# Patient Record
Sex: Female | Born: 1935 | Race: White | Hispanic: No | Marital: Single | Smoking: Former smoker
Health system: Southern US, Community
[De-identification: ages and names within clinical notes are randomized; demographics above are authoritative.]

## PROBLEM LIST (undated history)

## (undated) DIAGNOSIS — C189 Malignant neoplasm of colon, unspecified: Secondary | ICD-10-CM

## (undated) DIAGNOSIS — I10 Essential (primary) hypertension: Secondary | ICD-10-CM

## (undated) DIAGNOSIS — M069 Rheumatoid arthritis, unspecified: Secondary | ICD-10-CM

## (undated) DIAGNOSIS — E876 Hypokalemia: Secondary | ICD-10-CM

## (undated) DIAGNOSIS — E871 Hypo-osmolality and hyponatremia: Secondary | ICD-10-CM

## (undated) DIAGNOSIS — M810 Age-related osteoporosis without current pathological fracture: Secondary | ICD-10-CM

## (undated) DIAGNOSIS — C880 Waldenstrom macroglobulinemia: Secondary | ICD-10-CM

## (undated) DIAGNOSIS — C8590 Non-Hodgkin lymphoma, unspecified, unspecified site: Secondary | ICD-10-CM

## (undated) HISTORY — PX: APPENDECTOMY: SHX54

## (undated) HISTORY — PX: ABDOMINAL HYSTERECTOMY: SHX81

## (undated) HISTORY — PX: PARTIAL COLECTOMY: SHX5273

---

## 2012-10-18 DIAGNOSIS — C189 Malignant neoplasm of colon, unspecified: Secondary | ICD-10-CM

## 2012-10-18 HISTORY — DX: Malignant neoplasm of colon, unspecified: C18.9

## 2013-05-16 ENCOUNTER — Emergency Department (HOSPITAL_BASED_OUTPATIENT_CLINIC_OR_DEPARTMENT_OTHER): Payer: Medicare Other

## 2013-05-16 ENCOUNTER — Inpatient Hospital Stay (HOSPITAL_BASED_OUTPATIENT_CLINIC_OR_DEPARTMENT_OTHER)
Admission: EM | Admit: 2013-05-16 | Discharge: 2013-05-22 | DRG: 871 | Disposition: A | Discharge status: Skilled Nursing Facility | Payer: Medicare Other | Attending: Internal Medicine | Admitting: Internal Medicine

## 2013-05-16 ENCOUNTER — Encounter (HOSPITAL_BASED_OUTPATIENT_CLINIC_OR_DEPARTMENT_OTHER): Payer: Self-pay | Admitting: Emergency Medicine

## 2013-05-16 DIAGNOSIS — M069 Rheumatoid arthritis, unspecified: Secondary | ICD-10-CM

## 2013-05-16 DIAGNOSIS — Z7982 Long term (current) use of aspirin: Secondary | ICD-10-CM

## 2013-05-16 DIAGNOSIS — E785 Hyperlipidemia, unspecified: Secondary | ICD-10-CM

## 2013-05-16 DIAGNOSIS — R197 Diarrhea, unspecified: Secondary | ICD-10-CM | POA: Diagnosis present

## 2013-05-16 DIAGNOSIS — I10 Essential (primary) hypertension: Secondary | ICD-10-CM

## 2013-05-16 DIAGNOSIS — A419 Sepsis, unspecified organism: Secondary | ICD-10-CM | POA: Diagnosis present

## 2013-05-16 DIAGNOSIS — Z85038 Personal history of other malignant neoplasm of large intestine: Secondary | ICD-10-CM

## 2013-05-16 DIAGNOSIS — C189 Malignant neoplasm of colon, unspecified: Secondary | ICD-10-CM

## 2013-05-16 DIAGNOSIS — J329 Chronic sinusitis, unspecified: Secondary | ICD-10-CM | POA: Diagnosis present

## 2013-05-16 DIAGNOSIS — E876 Hypokalemia: Secondary | ICD-10-CM | POA: Diagnosis present

## 2013-05-16 DIAGNOSIS — R4182 Altered mental status, unspecified: Secondary | ICD-10-CM

## 2013-05-16 DIAGNOSIS — IMO0002 Reserved for concepts with insufficient information to code with codable children: Secondary | ICD-10-CM

## 2013-05-16 DIAGNOSIS — Z66 Do not resuscitate: Secondary | ICD-10-CM | POA: Diagnosis present

## 2013-05-16 DIAGNOSIS — Z79899 Other long term (current) drug therapy: Secondary | ICD-10-CM

## 2013-05-16 DIAGNOSIS — J189 Pneumonia, unspecified organism: Secondary | ICD-10-CM | POA: Diagnosis present

## 2013-05-16 HISTORY — DX: Rheumatoid arthritis, unspecified: M06.9

## 2013-05-16 HISTORY — DX: Essential (primary) hypertension: I10

## 2013-05-16 LAB — CBC WITH DIFFERENTIAL/PLATELET
BASOS PCT: 0 % (ref 0–1)
Basophils Absolute: 0 10*3/uL (ref 0.0–0.1)
EOS ABS: 0 10*3/uL (ref 0.0–0.7)
Eosinophils Relative: 0 % (ref 0–5)
HEMATOCRIT: 39 % (ref 36.0–46.0)
HEMOGLOBIN: 13.1 g/dL (ref 12.0–15.0)
Lymphocytes Relative: 3 % — ABNORMAL LOW (ref 12–46)
Lymphs Abs: 0.7 10*3/uL (ref 0.7–4.0)
MCH: 28.7 pg (ref 26.0–34.0)
MCHC: 33.6 g/dL (ref 30.0–36.0)
MCV: 85.5 fL (ref 78.0–100.0)
MONOS PCT: 4 % (ref 3–12)
Monocytes Absolute: 0.8 10*3/uL (ref 0.1–1.0)
NEUTROS PCT: 93 % — AB (ref 43–77)
Neutro Abs: 21.5 10*3/uL — ABNORMAL HIGH (ref 1.7–7.7)
Platelets: 371 10*3/uL (ref 150–400)
RBC: 4.56 MIL/uL (ref 3.87–5.11)
RDW: 16.4 % — ABNORMAL HIGH (ref 11.5–15.5)
WBC: 23.1 10*3/uL — ABNORMAL HIGH (ref 4.0–10.5)

## 2013-05-16 LAB — COMPREHENSIVE METABOLIC PANEL
ALT: 17 U/L (ref 0–35)
AST: 25 U/L (ref 0–37)
Albumin: 3.6 g/dL (ref 3.5–5.2)
Alkaline Phosphatase: 86 U/L (ref 39–117)
BILIRUBIN TOTAL: 0.4 mg/dL (ref 0.3–1.2)
BUN: 14 mg/dL (ref 6–23)
CHLORIDE: 92 meq/L — AB (ref 96–112)
CO2: 26 mEq/L (ref 19–32)
Calcium: 9.3 mg/dL (ref 8.4–10.5)
Creatinine, Ser: 0.6 mg/dL (ref 0.50–1.10)
GFR calc non Af Amer: 83 mL/min — ABNORMAL LOW (ref >90)
GLUCOSE: 116 mg/dL — AB (ref 70–99)
POTASSIUM: 3 meq/L — AB (ref 3.7–5.3)
Sodium: 135 mEq/L — ABNORMAL LOW (ref 137–147)
TOTAL PROTEIN: 6.5 g/dL (ref 6.0–8.3)

## 2013-05-16 LAB — URINALYSIS, ROUTINE W REFLEX MICROSCOPIC
Bilirubin Urine: NEGATIVE
Glucose, UA: NEGATIVE mg/dL
KETONES UR: NEGATIVE mg/dL
LEUKOCYTES UA: NEGATIVE
Nitrite: NEGATIVE
PROTEIN: NEGATIVE mg/dL
Specific Gravity, Urine: 1.009 (ref 1.005–1.030)
Urobilinogen, UA: 0.2 mg/dL (ref 0.0–1.0)
pH: 7 (ref 5.0–8.0)

## 2013-05-16 LAB — I-STAT CG4 LACTIC ACID, ED: LACTIC ACID, VENOUS: 1.75 mmol/L (ref 0.5–2.2)

## 2013-05-16 LAB — URINE MICROSCOPIC-ADD ON

## 2013-05-16 LAB — CBG MONITORING, ED: Glucose-Capillary: 115 mg/dL — ABNORMAL HIGH (ref 70–99)

## 2013-05-16 MED ORDER — VANCOMYCIN HCL IN DEXTROSE 1-5 GM/200ML-% IV SOLN
1000.0000 mg | Freq: Once | INTRAVENOUS | Status: AC
  Administered 2013-05-16: 1000 mg via INTRAVENOUS
  Filled 2013-05-16: qty 200

## 2013-05-16 MED ORDER — CEFEPIME HCL 2 G IJ SOLR
INTRAMUSCULAR | Status: AC
  Filled 2013-05-16: qty 2

## 2013-05-16 MED ORDER — ACETAMINOPHEN 650 MG RE SUPP
RECTAL | Status: AC
  Filled 2013-05-16: qty 1

## 2013-05-16 MED ORDER — SODIUM CHLORIDE 0.9 % IV SOLN
1000.0000 mL | INTRAVENOUS | Status: DC
  Administered 2013-05-17: 1000 mL via INTRAVENOUS

## 2013-05-16 MED ORDER — ACETAMINOPHEN 650 MG RE SUPP
650.0000 mg | Freq: Once | RECTAL | Status: AC
  Administered 2013-05-16: 650 mg via RECTAL

## 2013-05-16 MED ORDER — SODIUM CHLORIDE 0.9 % IV SOLN
1000.0000 mL | Freq: Once | INTRAVENOUS | Status: AC
  Administered 2013-05-16: 1000 mL via INTRAVENOUS

## 2013-05-16 MED ORDER — DEXTROSE 5 % IV SOLN
2.0000 g | Freq: Once | INTRAVENOUS | Status: AC
  Administered 2013-05-16: 2 g via INTRAVENOUS

## 2013-05-16 NOTE — ED Notes (Signed)
tct [facility] to verify medications, pt states that she has them filled at [facility], but is unable to verify what she takes daily

## 2013-05-16 NOTE — ED Notes (Signed)
2 liters nasal cannula applied.

## 2013-05-16 NOTE — ED Notes (Signed)
Code sepsis initiated per md request and protocol

## 2013-05-16 NOTE — ED Notes (Signed)
Spoke with provider re: pt code sepsis status.

## 2013-05-16 NOTE — ED Notes (Signed)
Received information from EMS for [facility], pt stated that her name was not Nunzio Cory  That it was Rebecca Hodges, pt with no identification on her or given at time of ems arrival. Pt did state that she gets her medications filled at [facility] and that her address is [address] [place]

## 2013-05-16 NOTE — ED Notes (Signed)
Pt reports that she has had n/v/d for weeks, has seen pcp, also c/o lower back pain, pt became non compliant with ems refusing to answer questions,

## 2013-05-16 NOTE — ED Provider Notes (Signed)
CSN: [id number]     Arrival date & time 05/16/13  2205 History   First MD Initiated Contact with Patient 05/16/13 2214     Chief Complaint  Patient presents with  . Diarrhea     (Consider location/radiation/quality/duration/timing/severity/associated sxs/prior Treatment) HPI Comments: Patient is an 80 year old female with a past medical history of rheumatoid arthritis who presents to the emergency department via EMS from her independent living facility after she called 911 complaining of nausea, vomiting and diarrhea "for weeks" according to EMS. She has seen her primary care physician, however is unable to give any information about this visit. It is noted she was noncompliant with EMS refused to answer questions. Patient herself only states she has low back pain, however tells the nurses she had nausea, vomiting and diarrhea which she denies to myself. She seems confused, unable to provide any information to the history, level 5 caveat due to altered mental status.  Patient is a 80 y.o. female presenting with diarrhea. The history is provided by the EMS personnel.  Diarrhea   Past Medical History  Diagnosis Date  . Rheumatoid arthritis    History reviewed. No pertinent past surgical history. History reviewed. No pertinent family history. History  Substance Use Topics  . Smoking status: Never Smoker   . Smokeless tobacco: Not on file  . Alcohol Use: No   OB History   Grav Para Term Preterm Abortions TAB SAB Ect Mult Living                 Review of Systems  Unable to perform ROS: Mental status change  Gastrointestinal: Positive for diarrhea.      Allergies  Penicillins  Home Medications   Current Outpatient Rx  Name  Route  Sig  Dispense  Refill  . diltiazem (CARDIZEM CD) 240 MG 24 hr capsule   Oral   Take 240 mg by mouth daily.         . folic acid (FOLVITE) 1 MG tablet   Oral   Take 1 mg by mouth daily.         . hydrochlorothiazide (HYDRODIURIL) 25 MG  tablet   Oral   Take 25 mg by mouth daily.         . methotrexate (RHEUMATREX) 2.5 MG tablet   Oral   Take 15 mg by mouth once a week. Caution:Chemotherapy. Protect from light.         . metoprolol (LOPRESSOR) 50 MG tablet   Oral   Take 75 mg by mouth every morning.         . predniSONE (DELTASONE) 5 MG tablet   Oral   Take 5 mg by mouth daily with breakfast.         . simvastatin (ZOCOR) 10 MG tablet   Oral   Take 10 mg by mouth daily.          BP 193/88  Pulse 92  Temp(Src) 102.5 F (39.2 C) (Rectal)  Resp 22  SpO2 95% Physical Exam  Nursing note and vitals reviewed. Constitutional: She appears well-developed and well-nourished. She appears ill.  HENT:  Head: Normocephalic and atraumatic.  Mouth/Throat: Oropharynx is clear and moist. Mucous membranes are dry.  Eyes: Conjunctivae are normal.  Neck: Normal range of motion. Neck supple.  Cardiovascular: Normal rate, regular rhythm and normal heart sounds.   Pulmonary/Chest: Effort normal. She has no wheezes. She has rhonchi (right lung field, minimal). She has no rales.  Wet sounding cough present.  Abdominal: Soft. Bowel sounds are normal. She exhibits no distension.  No peritoneal signs. Pt groans throughout abdominal exam.  Musculoskeletal: Normal range of motion. She exhibits no edema.  Neurological: GCS eye subscore is 3. GCS verbal subscore is 4. GCS motor subscore is 5.  Skin: Skin is warm and dry. She is not diaphoretic.    ED Course  Procedures (including critical care time) Labs Review Labs Reviewed  CBC WITH DIFFERENTIAL - Abnormal; Notable for the following:    WBC 23.1 (*)    RDW 16.4 (*)    Neutrophils Relative % 93 (*)    Neutro Abs 21.5 (*)    Lymphocytes Relative 3 (*)    All other components within normal limits  COMPREHENSIVE METABOLIC PANEL - Abnormal; Notable for the following:    Sodium 135 (*)    Potassium 3.0 (*)    Chloride 92 (*)    Glucose, Bld 116 (*)    GFR calc non  Af Amer 83 (*)    All other components within normal limits  URINALYSIS, ROUTINE W REFLEX MICROSCOPIC - Abnormal; Notable for the following:    Hgb urine dipstick TRACE (*)    All other components within normal limits  CBG MONITORING, ED - Abnormal; Notable for the following:    Glucose-Capillary 115 (*)    All other components within normal limits  CULTURE, BLOOD (ROUTINE X 2)  CULTURE, BLOOD (ROUTINE X 2)  URINE CULTURE  URINE MICROSCOPIC-ADD ON  LACTIC ACID, PLASMA  I-STAT CG4 LACTIC ACID, ED   Imaging Review Dg Chest Port 1 View  05/16/2013   CLINICAL DATA:  Altered mental status, nausea vomiting and diarrhea 4 weeks  EXAM: PORTABLE CHEST - 1 VIEW  COMPARISON:  Recent prior chest x-ray 04/21/2013; CT chest 04/28/2013  FINDINGS: Interval development of dense right basilar infiltrate. Cardiac and mediastinal contours are unchanged. There is mild cardiomegaly. Slightly increased pulmonary vascular congestion. Persistent background changes of emphysema and COPD. No acute osseous abnormality.  IMPRESSION: Interval development of dense right basilar infiltrate concerning for pneumonia or potentially aspiration.   Electronically Signed   By: Jacqulynn Cadet M.D.   On: 05/16/2013 22:50     EKG Interpretation   Date/Time:  Monday May 16 2013 23:14:43 EDT Ventricular Rate:  124 PR Interval:    QRS Duration: 160 QT Interval:  416 QTC Calculation: 597 R Axis:   135 Text Interpretation:  Wide QRS tachycardia Right bundle branch block Left  posterior fascicular block T wave abnormality, consider inferior ischemia  Abnormal ECG No previous ECGs available Reconfirmed by RANCOUR  MD,  STEPHEN [phone]) on 05/17/2013 12:38:31 AM      MDM   Final diagnoses:  CAP (community acquired pneumonia)  Altered mental status   Pt presenting with fever, AMS, n/v/d. Febrile at 102.5 rectally.EMS unable to get a good history from patient. No tachycardia. Respirations 22. Given patient's presentation  and altered mental status, will do septic workup. Harsh sounding cough, O2 sat 92% on room air. Labs, CXR pending. If normal creatinine, will obtain CT abdomen/pelvis. 11:06 PM Creatinine normal. Will obtain CT abdomen/pelvis with contrast. Chest x-ray showing interval development of dense right basilar infiltrate concerning for pneumonia or potentially aspiration, will start IV vancomycin and cefepime, these antibiotics chosen by both myself and Dr. Wyvonnia Dusky as patient may also have an intra-abdominal infection as well and will need broad-spectrum. 11:18 PM Pt now tachycardic. Meets criteria for code sepsis. Will call code sepsis. 11:39 PM Daughter spoke with  nurses on phone, states pt was normal yesterday, has had a cough for a few weeks, PCP said it was bronchitis. Pt is a DNR. 12:30 AM  After receiving 3 L of fluid, patient had a complete turn around and appears very well. She is alert and oriented x3, sitting up and conversing normally. States she has had a cough for the past week, had an episode of diarrhea yesterday. Denies abdominal pain or vomiting. Abdomen is soft and nontender. Will cancel CT abdomen/pelvis. Patient will be admitted to [facility] for her pneumonia. Vitals have improved, temperature 100.4, O2 sat 95% on 2 L of oxygen, heart rate in the 70s. Admission accepted by Dr. Humphrey Rolls, [facility], med-surg bed. Case discussed with attending Dr. Wyvonnia Dusky who also evaluated patient and agrees with plan of care.   Illene Labrador, PA-C 05/17/13 [phone]

## 2013-05-17 ENCOUNTER — Encounter (HOSPITAL_BASED_OUTPATIENT_CLINIC_OR_DEPARTMENT_OTHER): Payer: Self-pay | Admitting: Radiology

## 2013-05-17 DIAGNOSIS — C189 Malignant neoplasm of colon, unspecified: Secondary | ICD-10-CM

## 2013-05-17 DIAGNOSIS — M069 Rheumatoid arthritis, unspecified: Secondary | ICD-10-CM

## 2013-05-17 DIAGNOSIS — J189 Pneumonia, unspecified organism: Secondary | ICD-10-CM | POA: Diagnosis present

## 2013-05-17 DIAGNOSIS — E785 Hyperlipidemia, unspecified: Secondary | ICD-10-CM

## 2013-05-17 DIAGNOSIS — I10 Essential (primary) hypertension: Secondary | ICD-10-CM

## 2013-05-17 LAB — EXPECTORATED SPUTUM ASSESSMENT W GRAM STAIN, RFLX TO RESP C

## 2013-05-17 LAB — CBC WITH DIFFERENTIAL/PLATELET
Basophils Absolute: 0 10*3/uL (ref 0.0–0.1)
Basophils Relative: 0 % (ref 0–1)
EOS PCT: 0 % (ref 0–5)
Eosinophils Absolute: 0 10*3/uL (ref 0.0–0.7)
HCT: 34.4 % — ABNORMAL LOW (ref 36.0–46.0)
HEMOGLOBIN: 11.5 g/dL — AB (ref 12.0–15.0)
Lymphocytes Relative: 6 % — ABNORMAL LOW (ref 12–46)
Lymphs Abs: 1.9 10*3/uL (ref 0.7–4.0)
MCH: 28.4 pg (ref 26.0–34.0)
MCHC: 33.4 g/dL (ref 30.0–36.0)
MCV: 84.9 fL (ref 78.0–100.0)
MONO ABS: 1 10*3/uL (ref 0.1–1.0)
Monocytes Relative: 3 % (ref 3–12)
NEUTROS PCT: 91 % — AB (ref 43–77)
Neutro Abs: 29.1 10*3/uL — ABNORMAL HIGH (ref 1.7–7.7)
Platelets: 325 10*3/uL (ref 150–400)
RBC: 4.05 MIL/uL (ref 3.87–5.11)
RDW: 16.5 % — ABNORMAL HIGH (ref 11.5–15.5)
WBC MORPHOLOGY: INCREASED
WBC: 32 10*3/uL — ABNORMAL HIGH (ref 4.0–10.5)

## 2013-05-17 LAB — BASIC METABOLIC PANEL
BUN: 11 mg/dL (ref 6–23)
CO2: 23 meq/L (ref 19–32)
Calcium: 7.6 mg/dL — ABNORMAL LOW (ref 8.4–10.5)
Chloride: 97 mEq/L (ref 96–112)
Creatinine, Ser: 0.56 mg/dL (ref 0.50–1.10)
GFR calc Af Amer: >90 mL/min (ref >90)
GFR, EST NON AFRICAN AMERICAN: 85 mL/min — AB (ref >90)
GLUCOSE: 95 mg/dL (ref 70–99)
POTASSIUM: 2.6 meq/L — AB (ref 3.7–5.3)
Sodium: 136 mEq/L — ABNORMAL LOW (ref 137–147)

## 2013-05-17 LAB — EXPECTORATED SPUTUM ASSESSMENT W REFEX TO RESP CULTURE

## 2013-05-17 MED ORDER — POTASSIUM CHLORIDE CRYS ER 20 MEQ PO TBCR
30.0000 meq | EXTENDED_RELEASE_TABLET | ORAL | Status: AC
  Administered 2013-05-17 (×3): 30 meq via ORAL
  Filled 2013-05-17 (×4): qty 1

## 2013-05-17 MED ORDER — GUAIFENESIN 100 MG/5ML PO SYRP
200.0000 mg | ORAL_SOLUTION | ORAL | Status: DC | PRN
  Administered 2013-05-17 – 2013-05-21 (×6): 200 mg via ORAL
  Filled 2013-05-17 (×6): qty 10

## 2013-05-17 MED ORDER — METOPROLOL SUCCINATE ER 50 MG PO TB24
75.0000 mg | ORAL_TABLET | Freq: Every day | ORAL | Status: DC
  Administered 2013-05-17 – 2013-05-22 (×6): 75 mg via ORAL
  Filled 2013-05-17 (×6): qty 1

## 2013-05-17 MED ORDER — ASPIRIN EC 81 MG PO TBEC
81.0000 mg | DELAYED_RELEASE_TABLET | Freq: Every day | ORAL | Status: DC
  Administered 2013-05-17 – 2013-05-21 (×5): 81 mg via ORAL
  Filled 2013-05-17 (×6): qty 1

## 2013-05-17 MED ORDER — POTASSIUM CHLORIDE CRYS ER 20 MEQ PO TBCR
30.0000 meq | EXTENDED_RELEASE_TABLET | Freq: Once | ORAL | Status: AC
  Administered 2013-05-17: 30 meq via ORAL
  Filled 2013-05-17: qty 1

## 2013-05-17 MED ORDER — SODIUM CHLORIDE 0.9 % IV SOLN
500.0000 mg | Freq: Two times a day (BID) | INTRAVENOUS | Status: DC
  Administered 2013-05-17 – 2013-05-19 (×4): 500 mg via INTRAVENOUS
  Filled 2013-05-17 (×6): qty 500

## 2013-05-17 MED ORDER — SIMVASTATIN 10 MG PO TABS
10.0000 mg | ORAL_TABLET | Freq: Every day | ORAL | Status: DC
  Administered 2013-05-17 – 2013-05-21 (×5): 10 mg via ORAL
  Filled 2013-05-17 (×6): qty 1

## 2013-05-17 MED ORDER — ENOXAPARIN SODIUM 40 MG/0.4ML ~~LOC~~ SOLN
40.0000 mg | Freq: Every day | SUBCUTANEOUS | Status: DC
  Administered 2013-05-17 – 2013-05-22 (×6): 40 mg via SUBCUTANEOUS
  Filled 2013-05-17 (×6): qty 0.4

## 2013-05-17 MED ORDER — PREDNISONE 5 MG PO TABS
7.5000 mg | ORAL_TABLET | Freq: Every day | ORAL | Status: DC
  Administered 2013-05-17 – 2013-05-22 (×6): 7.5 mg via ORAL
  Filled 2013-05-17 (×7): qty 1

## 2013-05-17 MED ORDER — IOHEXOL 300 MG/ML  SOLN: 50.0000 mL | Freq: Once | INTRAMUSCULAR | Status: DC | PRN

## 2013-05-17 MED ORDER — DILTIAZEM HCL ER COATED BEADS 240 MG PO CP24
240.0000 mg | ORAL_CAPSULE | Freq: Every day | ORAL | Status: DC
  Administered 2013-05-17 – 2013-05-22 (×6): 240 mg via ORAL
  Filled 2013-05-17 (×6): qty 1

## 2013-05-17 MED ORDER — WHITE PETROLATUM GEL
Status: AC
Start: 2013-05-17 — End: 2013-05-17
  Administered 2013-05-17: 0.2
  Filled 2013-05-17: qty 5

## 2013-05-17 MED ORDER — DEXTROSE 5 % IV SOLN
2.0000 g | INTRAVENOUS | Status: DC
  Administered 2013-05-17 – 2013-05-18 (×2): 2 g via INTRAVENOUS
  Filled 2013-05-17 (×3): qty 2

## 2013-05-17 MED ORDER — ACETAMINOPHEN 325 MG PO TABS
650.0000 mg | ORAL_TABLET | Freq: Four times a day (QID) | ORAL | Status: DC | PRN
  Administered 2013-05-17 – 2013-05-21 (×4): 650 mg via ORAL
  Filled 2013-05-17 (×4): qty 2

## 2013-05-17 MED ORDER — SODIUM CHLORIDE 0.9 % IV SOLN: INTRAVENOUS | Status: DC

## 2013-05-17 MED ORDER — WHITE PETROLATUM GEL
Status: AC
Start: 2013-05-17 — End: 2013-05-17
  Administered 2013-05-17: 14:00:00
  Filled 2013-05-17: qty 5

## 2013-05-17 MED ORDER — DEXTROSE 5 % IV SOLN
1.0000 g | Freq: Three times a day (TID) | INTRAVENOUS | Status: DC
  Administered 2013-05-17: 1 g via INTRAVENOUS
  Filled 2013-05-17 (×4): qty 1

## 2013-05-17 NOTE — ED Provider Notes (Signed)
Medical screening examination/treatment/procedure(s) were conducted as a shared visit with non-physician practitioner(s) and myself.  I personally evaluated the patient during the encounter.  From home with fever, tachycardia and cough, questionable diarrhea and vomiting.  Ill appearing with moist cough. Scattered rhonchi. Abdomen soft and nontender   EKG Interpretation   Date/Time:  Monday May 16 2013 23:14:43 EDT Ventricular Rate:  124 PR Interval:    QRS Duration: 160 QT Interval:  416 QTC Calculation: 597 R Axis:   135 Text Interpretation:  Wide QRS tachycardia Right bundle branch block Left  posterior fascicular block T wave abnormality, consider inferior ischemia  Abnormal ECG No previous ECGs available Reconfirmed by Mukhtar Shams  MD,  Jaycub Noorani [phone]) on 05/17/2013 12:38:31 AM     CRITICAL CARE Performed by: Ezequiel Essex Total critical care time: 30 Critical care time was exclusive of separately billable procedures and treating other patients. Critical care was necessary to treat or prevent imminent or life-threatening deterioration. Critical care was time spent personally by me on the following activities: development of treatment plan with patient and/or surrogate as well as nursing, discussions with consultants, evaluation of patient's response to treatment, examination of patient, obtaining history from patient or surrogate, ordering and performing treatments and interventions, ordering and review of laboratory studies, ordering and review of radiographic studies, pulse oximetry and re-evaluation of patient's condition.    Ezequiel Essex, MD 05/17/13 [phone]

## 2013-05-17 NOTE — ED Notes (Signed)
Pt sleeping, nad noted.  Respirations even and unlabored. Waiting transport to [facility].

## 2013-05-17 NOTE — Progress Notes (Signed)
Clinical Social Work Department BRIEF PSYCHOSOCIAL ASSESSMENT 05/17/2013  Patient:  Rebecca Hodges, Rebecca Hodges     Account Number:  [id number]     Admit date:  05/16/2013  Clinical Social Worker:  Pete Pelt, [place]  Date/Time:  05/17/2013 05:38 PM  Referred by:  Physician  Date Referred:  05/17/2013 Referred for  SNF Placement   Other Referral:   Interview type:  Patient Other interview type:    PSYCHOSOCIAL DATA Living Status:  FACILITY Admitted from facility:  [facility] Level of care:  Independent Living Primary support name:  Evon Dejarnett Primary support relationship to patient:  CHILD, ADULT Degree of support available:   Pt has a good support system. Pt is having daughter from [facility] [place] come up and stay with her post d/c    CURRENT CONCERNS Current Concerns  Post-Acute Placement   Other Concerns:    SOCIAL WORK ASSESSMENT / PLAN CSW met with the Pt at the bedisde. CSW introduced self and role. Pt is alert and oriented x4. Pt was not aware that CSW would be meeting with Pt at this time. Pt stated that she resides at [facility] [place] in the [place] [facility] and that she does plan to return to that residence at the time of d/c. Pt stated that she is concerned that she had an altered mental status event but that she would feel more comfortable in her own home. Pt stated that she is having a daughter come and stay with her for a week and if she feels more assistance is needed she will hire an aid to assist with her ADL's etc. CSW explained that SNF could be an option and Pt stated that she would consider SNF option if Pt feels a higher level of care is needed. CSW will provide support to Pt if needed during admission .   Assessment/plan status:  Information/Referral to [employer] Other assessment/ plan:   Information/referral to community resources:   Pt does not want any additional information at this time     PATIENT'S/FAMILY'S RESPONSE TO PLAN OF CARE: Pt was appreciative for visit, however denies the need for SNF services at this time. CSW did contact facility and the could assist Pt with SNF services if needed in the future. CSW will follow for support.        [facility]  [address];  [phone] Phone: [phone]

## 2013-05-17 NOTE — Progress Notes (Signed)
UR complete.  Daina Cara RN, MSN 

## 2013-05-17 NOTE — ED Notes (Signed)
Pt now mentating better, opening eyes and now able to carry conversation.  Appears more confluent, skin color less pale.

## 2013-05-17 NOTE — Progress Notes (Signed)
CRITICAL VALUE ALERT  Critical value received:  K 2.6  Date of notification:  05/17/2013  Time of notification:  1330  Critical value read back:yes  Nurse who received alert:  [place] Ota Ebersole  MD notified (1st page):  Dr Cruzita Lederer  Time of first page:  1338  MD notified (2nd page):  Time of second page:  Responding MD:  Cruzita Lederer  Time MD responded:  1345

## 2013-05-17 NOTE — Progress Notes (Signed)
Pt arrived to unit 4N07 visibly fatigued. She was transferred to the bed and oriented to the room. Call bell is at her side and telemetry was placed along with O2 and continuous pulse ox. Vitals were taken. Will continue to monitor. Jerriah Ines, Rande Brunt

## 2013-05-17 NOTE — Progress Notes (Signed)
ANTIBIOTIC CONSULT NOTE - INITIAL  Pharmacy Consult for Vancomycin Indication:  pneumonia  Allergies  Allergen Reactions  . Penicillins     unknown    Patient Measurements: Height: 5\' 6"  (167.6 cm) Weight: 149 lb 0.5 oz (67.6 kg) IBW/kg (Calculated) : 59.3   Vital Signs: Temp: 98 F (36.7 C) (03/31 1057) Temp src: Oral (03/31 1057) BP: 122/53 mmHg (03/31 1057) Pulse Rate: 72 (03/31 1057) Intake/Output from previous day:   Intake/Output from this shift: Total I/O In: 100 [P.O.:100] Out: -   Labs:  Recent Labs  05/16/13 2230  WBC 23.1*  HGB 13.1  PLT 371  CREATININE 0.60   Estimated Creatinine Clearance: 51.6 ml/min (by C-G formula based on Cr of 0.6). No results found for this basename: VANCOTROUGH, VANCOPEAK, VANCORANDOM, GENTTROUGH, GENTPEAK, GENTRANDOM, TOBRATROUGH, TOBRAPEAK, TOBRARND, AMIKACINPEAK, AMIKACINTROU, AMIKACIN,  in the last 72 hours   Microbiology: No results found for this or any previous visit (from the past 720 hour(s)).  Medical History: Past Medical History  Diagnosis Date  . Rheumatoid arthritis   . Cancer 10/2012    colon  . Hypertension     Medications:  See med history  Assessment: 80 yo female started on cefepime on early this AM for bacteremia.   Afebrile. WBC 23.1K last night.  She received vancomycin 1 gm IV at [facility] @ 23:07 last night 05/16/13.  Now to continue on vancomycin IV for HCAP. Her SrCr 0.6 mg/dl.  Will adjust SCr to 1.0 due to age 18 y.o, thus estimated CrCl ~ 41 ml/min (SCR ~1.0).  History of colon cancer s/p resection in [place] in 10/2012 (no chemo or XRT per patient).   Goal of Therapy:  Vancomycin trough 15-20 mcg/ml Eradication of infection  Plan:  Vancomycin 500 mg IV q12 hours Adjust Cefepime to 2 gm IV q24 hours Monitor clinical status, renal function, culture results.  Check vancomcyin trough at steady state if continued >5 days or if renal function calls warrants checking level.  Nicole Cella,  RPh Clinical Pharmacist Pager: [phone] 05/17/2013,11:43 AM

## 2013-05-17 NOTE — Progress Notes (Signed)
ANTIBIOTIC CONSULT NOTE - INITIAL  Pharmacy Consult for cefepime Indication: bacteremia  Allergies  Allergen Reactions  . Penicillins     Patient Measurements:   Adjusted Body Weight:   Vital Signs: Temp: 100.4 F (38 C) (03/30 2347) Temp src: Oral (03/30 2347) BP: 140/65 mmHg (03/30 2347) Pulse Rate: 123 (03/30 2347) Intake/Output from previous day:   Intake/Output from this shift:    Labs:  Recent Labs  05/16/13 2230  WBC 23.1*  HGB 13.1  PLT 371  CREATININE 0.60   CrCl is unknown because there is no height on file for the current visit. No results found for this basename: VANCOTROUGH, VANCOPEAK, VANCORANDOM, GENTTROUGH, GENTPEAK, GENTRANDOM, TOBRATROUGH, TOBRAPEAK, TOBRARND, AMIKACINPEAK, AMIKACINTROU, AMIKACIN,  in the last 72 hours   Microbiology: No results found for this or any previous visit (from the past 720 hour(s)).  Medical History: Past Medical History  Diagnosis Date  . Rheumatoid arthritis   . Cancer 10/2012    colon  . Hypertension     Medications:  See med history Assessment: 80 yo lady to start cefepime for bacteremia.  Her SrCr 0.6 mg/dl.  Goal of Therapy:  Eradication of infection  Plan:  Cefepime 2 gm IV X 1 then 1 gm IV q8 hours Also rec'd vanc 1 gm at [facility] @ 2307.  Rebecca Hodges 05/17/2013,12:31 AM

## 2013-05-17 NOTE — ED Notes (Signed)
Pt placed on bedpan

## 2013-05-17 NOTE — Progress Notes (Signed)
RN sent CD with radiology images from [facility] to medical records per request of Dr Cruzita Lederer, who then referred RN to radiology department.  Radiology assistant was able to gather image final reports from the CT chest and CT facial/maxillary scans. These reports were printed and laid on top of chart for MD to review.  A copy of these reports were sent to medical records and it was requested that they scan this into epic.  Medical records was unsure if this could be done, but was going to try. Rebecca Hodges, Rebecca Hodges 05/17/2013 6:33 PM

## 2013-05-17 NOTE — H&P (Signed)
History and Physical    Rebecca Hodges GLO:[id number] DOB: 02-Jul-1929 DOA: 05/16/2013  Referring physician: Michele Mcalpine PCP: Pcp Not In System  Specialists: none  Chief Complaint: cough/weakness  HPI: Rebecca Hodges is a 80 y.o. female has a past medical history significant for RA on MTX and PDN, history of colon cancer s/p resection in [place] in 10/2012 (no chemo or XRT per patient), currently staying in an independent living facility presented to ED for generalized fatigue, cough, nausea, vomiting and diarrhea. Patient seen in the ED overnight with mild confusion, much more alert this morning on my evaluation. She states that her diarrhea was very mild and lasted for a day, she has been feeling "lousy" for the past couple of days with worsening of her chronic cough. She just had a CT chest to evaluate her cough couple of weeks ago and her daughter will bring in the CT today. Now she denies nausea/vomiting or diarrhea, still feels very weak and coughing. Denies fever or chills. Denies chest pain, lightheadedness or dizziness. She has pain in her lateral right side of her chest with coughing. She is AxOx4.  In the ED, she was septic and a CXR showed pneumonia and was direct admitted to the floor.   Review of Systems: as per HPI otherwise negative  Past Medical History  Diagnosis Date  . Rheumatoid arthritis   . Cancer 10/2012    colon  . Hypertension    History reviewed. No pertinent past surgical history. Social History:  reports that she has never smoked. She does not have any smokeless tobacco history on file. She reports that she does not drink alcohol. Her drug history is not on file.  Allergies  Allergen Reactions  . Penicillins     unknown   History reviewed. No pertinent family history.   Prior to Admission medications   Medication Sig Start Date End Date Taking? Authorizing Provider  aspirin 81 MG tablet Take 81 mg by mouth daily.   Yes Historical  Provider, MD  diltiazem (CARDIZEM CD) 240 MG 24 hr capsule Take 240 mg by mouth daily.   Yes Historical Provider, MD  folic acid (FOLVITE) 1 MG tablet Take 1 mg by mouth daily.   Yes Historical Provider, MD  hydrochlorothiazide (HYDRODIURIL) 25 MG tablet Take 25 mg by mouth daily.   Yes Historical Provider, MD  methotrexate (RHEUMATREX) 2.5 MG tablet Take 15 mg by mouth once a week. Caution:Chemotherapy. Protect from light.   Yes Historical Provider, MD  metoprolol succinate (TOPROL-XL) 25 MG 24 hr tablet Take 75 mg by mouth daily.   Yes Historical Provider, MD  predniSONE (DELTASONE) 5 MG tablet Take 7.5 mg by mouth daily with breakfast.    Yes Historical Provider, MD  simvastatin (ZOCOR) 10 MG tablet Take 10 mg by mouth daily.   Yes Historical Provider, MD   Physical Exam: Filed Vitals:   05/17/13 0119 05/17/13 0247 05/17/13 0300 05/17/13 0524  BP: 114/52 107/53 99/60 115/62  Pulse: 84 76 73 72  Temp: 99.3 F (37.4 C) 98.4 F (36.9 C) 98.6 F (37 C)   TempSrc:  Oral Oral Oral  Resp: 16 18 18 18   SpO2: 95% 96% 95% 95%     General:  No apparent distress,  Eyes: no scleral icterus  ENT: moist oropharynx  Neck: supple, no JVD  Cardiovascular: regular rate without MRG; 2+ peripheral pulses  Respiratory: good air movement, coarse breath sounds on right, no wheezing  Abdomen: soft, non  tender to palpation, positive bowel sounds, no guarding, no rebound  Skin: no rashes  Musculoskeletal: no peripheral edema  Psychiatric: normal mood and affect  Neurologic: non focal  Labs on Admission:  Basic Metabolic Panel:  Recent Labs Lab 05/16/13 2230  NA 135*  K 3.0*  CL 92*  CO2 26  GLUCOSE 116*  BUN 14  CREATININE 0.60  CALCIUM 9.3   Liver Function Tests:  Recent Labs Lab 05/16/13 2230  AST 25  ALT 17  ALKPHOS 86  BILITOT 0.4  PROT 6.5  ALBUMIN 3.6   CBC:  Recent Labs Lab 05/16/13 2230  WBC 23.1*  NEUTROABS 21.5*  HGB 13.1  HCT 39.0  MCV 85.5  PLT  371   CBG:  Recent Labs Lab 05/16/13 2226  GLUCAP 115*   Radiological Exams on Admission: Dg Chest Port 1 View  05/16/2013   CLINICAL DATA:  Altered mental status, nausea vomiting and diarrhea 4 weeks  EXAM: PORTABLE CHEST - 1 VIEW  COMPARISON:  Recent prior chest x-ray 04/21/2013; CT chest 04/28/2013  FINDINGS: Interval development of dense right basilar infiltrate. Cardiac and mediastinal contours are unchanged. There is mild cardiomegaly. Slightly increased pulmonary vascular congestion. Persistent background changes of emphysema and COPD. No acute osseous abnormality.  IMPRESSION: Interval development of dense right basilar infiltrate concerning for pneumonia or potentially aspiration.   Electronically Signed   By: Jacqulynn Cadet M.D.   On: 05/16/2013 22:50    Assessment/Plan Principal Problem:   HCAP (healthcare-associated pneumonia) Active Problems:   CAP (community acquired pneumonia)   Colon cancer   Rheumatoid arthritis   HTN (hypertension)   HLD (hyperlipidemia)    HCAP - Vancomycin and Cefepime for now, clinically improving this morning. Sepsis - on admission, due to #1 Leukocytosis  Hypokalemia RA - continue Prednisone, hold Methotrexate  HTN - continue Metop, hold Diltiazem  HLD - continue Zocor Chronic cough - will obtain CD with CT chest from daughter and upload.  Colon cancer, s/p resection 6 months ago. Outpatient follow up with her oncologist in [place] (Dr. Cruzita Lederer)  Diet: regular Fluids: none DVT Prophylaxis: Lovenox  Code Status: Full, but not entirely sure (will discuss with her daughter, likely to be DNR) Family Communication: none this morning  Disposition Plan: inpatient  Time spent: 57  This note has been created with Surveyor, quantity. Any transcriptional errors are unintentional.   Costin M. Cruzita Lederer, MD [facility] Pager [phone]  If 7PM-7AM, please contact  night-coverage [email or web address] Password [email or web address] 05/17/2013, 10:50 AM

## 2013-05-18 DIAGNOSIS — C189 Malignant neoplasm of colon, unspecified: Secondary | ICD-10-CM

## 2013-05-18 DIAGNOSIS — E785 Hyperlipidemia, unspecified: Secondary | ICD-10-CM

## 2013-05-18 DIAGNOSIS — J189 Pneumonia, unspecified organism: Secondary | ICD-10-CM

## 2013-05-18 DIAGNOSIS — R4182 Altered mental status, unspecified: Secondary | ICD-10-CM

## 2013-05-18 LAB — URINE CULTURE
Colony Count: NO GROWTH
Culture: NO GROWTH

## 2013-05-18 LAB — CBC
HEMATOCRIT: 35.6 % — AB (ref 36.0–46.0)
HEMOGLOBIN: 11.9 g/dL — AB (ref 12.0–15.0)
MCH: 28.4 pg (ref 26.0–34.0)
MCHC: 33.4 g/dL (ref 30.0–36.0)
MCV: 85 fL (ref 78.0–100.0)
Platelets: 318 10*3/uL (ref 150–400)
RBC: 4.19 MIL/uL (ref 3.87–5.11)
RDW: 16.7 % — ABNORMAL HIGH (ref 11.5–15.5)
WBC: 26.1 10*3/uL — AB (ref 4.0–10.5)

## 2013-05-18 LAB — CLOSTRIDIUM DIFFICILE BY PCR: CDIFFPCR: NEGATIVE

## 2013-05-18 LAB — BASIC METABOLIC PANEL
BUN: 10 mg/dL (ref 6–23)
CHLORIDE: 102 meq/L (ref 96–112)
CO2: 23 meq/L (ref 19–32)
CREATININE: 0.52 mg/dL (ref 0.50–1.10)
Calcium: 8.3 mg/dL — ABNORMAL LOW (ref 8.4–10.5)
GFR calc Af Amer: >90 mL/min (ref >90)
GFR calc non Af Amer: 87 mL/min — ABNORMAL LOW (ref >90)
GLUCOSE: 81 mg/dL (ref 70–99)
POTASSIUM: 3.9 meq/L (ref 3.7–5.3)
Sodium: 139 mEq/L (ref 137–147)

## 2013-05-18 LAB — LEGIONELLA ANTIGEN, URINE: Legionella Antigen, Urine: NEGATIVE

## 2013-05-18 LAB — GLUCOSE, CAPILLARY: GLUCOSE-CAPILLARY: 78 mg/dL (ref 70–99)

## 2013-05-18 LAB — STREP PNEUMONIAE URINARY ANTIGEN: STREP PNEUMO URINARY ANTIGEN: NEGATIVE

## 2013-05-18 MED ORDER — SODIUM CHLORIDE 0.9 % IV SOLN
INTRAVENOUS | Status: DC
  Administered 2013-05-18 – 2013-05-20 (×4): via INTRAVENOUS

## 2013-05-18 NOTE — Progress Notes (Signed)
[facility] PROGRESS NOTE  Rebecca Hodges HYQ:[id number] DOB: 11-12-1929 DOA: 05/16/2013 PCP: Pcp Not In System  Assessment/Plan: HCAP - Vancomycin and Cefepime for now. Leukocytosis improving. Afebrile overnight.  Sepsis - on admission, secondary to above Leukocytosis - secondary to sepsis with steroid use Hypokalemia  RA - continue Prednisone, hold Methotrexate  HTN - continue Metop, holding Diltiazem secondary to soft BP HLD - continue Zocor  Chronic cough - plan to obtain CD with CT chest from daughter and upload.  Colon cancer, s/p resection 6 months ago. Outpatient follow up with her oncologist in [place] (Dr. Cruzita Lederer)  Code Status: DNR Family Communication: Pt in room (indicate person spoken with, relationship, and if by phone, the number) Disposition Plan: Pending  Consultants:    Procedures:    Antibiotics:  Vancymycin 05/17/13>>>  Cefepime 05/17/13>>>  HPI/Subjective: Pt complains of diarrhea started one day.  Objective: Filed Vitals:   05/17/13 1744 05/17/13 2254 05/18/13 0159 05/18/13 0546  BP: 117/41 101/46 140/76 145/70  Pulse: 56 47 55 61  Temp: 98.3 F (36.8 C) 97.4 F (36.3 C) 97.5 F (36.4 C) 98.2 F (36.8 C)  TempSrc: Oral Oral Oral Oral  Resp: 18 18 20 18   Height:      Weight:      SpO2: 95% 98% 98% 96%    Intake/Output Summary (Last 24 hours) at 05/18/13 0825 Last data filed at 05/17/13 0900  Gross per 24 hour  Intake    100 ml  Output      0 ml  Net    100 ml   Filed Weights   05/17/13 1100  Weight: 67.6 kg (149 lb 0.5 oz)    Exam:   General:  Awake, in nad  Cardiovascular: regular, s1, s2  Respiratory: normal resp effort, no wheezing  Abdomen: soft, nondistended  Musculoskeletal: perfused, no clubbing   Data Reviewed: Basic Metabolic Panel:  Recent Labs Lab 05/16/13 2230 05/17/13 1141 05/18/13 0528  NA 135* 136* 139  K 3.0* 2.6* 3.9  CL 92* 97 102  CO2 26 23 23   GLUCOSE 116* 95 81  BUN 14 11  10   CREATININE 0.60 0.56 0.52  CALCIUM 9.3 7.6* 8.3*   Liver Function Tests:  Recent Labs Lab 05/16/13 2230  AST 25  ALT 17  ALKPHOS 86  BILITOT 0.4  PROT 6.5  ALBUMIN 3.6   No results found for this basename: LIPASE, AMYLASE,  in the last 168 hours No results found for this basename: AMMONIA,  in the last 168 hours CBC:  Recent Labs Lab 05/16/13 2230 05/17/13 1141 05/18/13 0528  WBC 23.1* 32.0* 26.1*  NEUTROABS 21.5* 29.1*  --   HGB 13.1 11.5* 11.9*  HCT 39.0 34.4* 35.6*  MCV 85.5 84.9 85.0  PLT 371 325 318   Cardiac Enzymes: No results found for this basename: CKTOTAL, CKMB, CKMBINDEX, TROPONINI,  in the last 168 hours BNP (last 3 results) No results found for this basename: PROBNP,  in the last 8760 hours CBG:  Recent Labs Lab 05/16/13 2226 05/18/13 0131  GLUCAP 115* 78    Recent Results (from the past 240 hour(s))  URINE CULTURE     Status: None   Collection Time    05/16/13 10:50 PM      Result Value Ref Range Status   Specimen Description URINE, CATHETERIZED   Final   Special Requests NONE   Final   Culture  Setup Time     Final   Value: 05/17/2013 09:56  Performed at [facility]     Final   Value: NO GROWTH     Performed at [facility]   Culture     Final   Value: NO GROWTH     Performed at [facility]   Report Status 05/18/2013 FINAL   Final  CULTURE, EXPECTORATED SPUTUM-ASSESSMENT     Status: None   Collection Time    05/17/13  3:35 PM      Result Value Ref Range Status   Specimen Description SPUTUM   Final   Special Requests NONE   Final   Sputum evaluation     Final   Value: THIS SPECIMEN IS ACCEPTABLE. RESPIRATORY CULTURE REPORT TO FOLLOW.   Report Status 05/17/2013 FINAL   Final     Studies: Dg Chest Port 1 View  05/16/2013   CLINICAL DATA:  Altered mental status, nausea vomiting and diarrhea 4 weeks  EXAM: PORTABLE CHEST - 1 VIEW  COMPARISON:  Recent prior chest x-ray 04/21/2013; CT  chest 04/28/2013  FINDINGS: Interval development of dense right basilar infiltrate. Cardiac and mediastinal contours are unchanged. There is mild cardiomegaly. Slightly increased pulmonary vascular congestion. Persistent background changes of emphysema and COPD. No acute osseous abnormality.  IMPRESSION: Interval development of dense right basilar infiltrate concerning for pneumonia or potentially aspiration.   Electronically Signed   By: Jacqulynn Cadet M.D.   On: 05/16/2013 22:50    Scheduled Meds: . aspirin EC  81 mg Oral Daily  . ceFEPime (MAXIPIME) IV  2 g Intravenous Q24H  . diltiazem  240 mg Oral Daily  . enoxaparin (LOVENOX) injection  40 mg Subcutaneous Daily  . metoprolol succinate  75 mg Oral Daily  . predniSONE  7.5 mg Oral Q breakfast  . simvastatin  10 mg Oral q1800  . vancomycin  500 mg Intravenous Q12H   Continuous Infusions:   Principal Problem:   HCAP (healthcare-associated pneumonia) Active Problems:   CAP (community acquired pneumonia)   Colon cancer   Rheumatoid arthritis   HTN (hypertension)   HLD (hyperlipidemia)  Time spent: 60min  Holston Oyama, [facility] Pager [phone]. If 7PM-7AM, please contact night-coverage at [email or web address], password [facility] 05/18/2013, 8:25 AM  LOS: 2 days

## 2013-05-18 NOTE — Progress Notes (Signed)
Pt complains of being really sweaty and cold. Vital signs checked and recorded.  CBG checked it was 74. Encouraged pt to drink juice. Will continue to monitor.

## 2013-05-18 NOTE — Evaluation (Signed)
Clinical/Bedside Swallow Evaluation Patient Details  Name: Rebecca Hodges MRN: [id number] Date of Birth: 11/12/1929  Today's Date: 05/18/2013 Time: 1330-1340 SLP Time Calculation (min): 10 min  Past Medical History:  Past Medical History  Diagnosis Date  . Rheumatoid arthritis   . Cancer 10/2012    colon  . Hypertension    Past Surgical History: History reviewed. No pertinent past surgical history. HPI:  80 y.o. female has a past medical history significant for RA, history of colon cancer s/p resection in [place] in 10/2012 (no chemo or XRT per patient), currently staying in an independent living facility presented to ED for generalized fatigue, cough, nausea, vomiting and diarrhea. In the ED, she was septic and a CXR showed Interval development of dense right basilar infiltrate concerning for pneumonia or potentially aspiration.    Assessment / Plan / Recommendation Clinical Impression  Pt. presents with a functional oropharyngeal swallow without indications of impairments.  She appears to be at low aspiration risk per past medical history.  SLP recommends she continue regular texture diet and thin liquids.  No f/u needed.     Aspiration Risk  Mild    Diet Recommendation Regular;Thin liquid   Liquid Administration via: Cup;Straw Medication Administration: Whole meds with liquid Supervision: Patient able to self feed Compensations: Slow rate;Small sips/bites Postural Changes and/or Swallow Maneuvers: Seated upright 90 degrees    Other  Recommendations Oral Care Recommendations: Oral care BID   Follow Up Recommendations  None    Frequency and Duration        Pertinent Vitals/Pain WDL      Swallow Study           Oral/Motor/Sensory Function Overall Oral Motor/Sensory Function: Appears within functional limits for tasks assessed   Ice Chips Ice chips: Not tested   Thin Liquid Thin Liquid: Within functional limits Presentation: Cup;Straw    Nectar Thick  Nectar Thick Liquid: Not tested   Honey Thick Honey Thick Liquid: Not tested   Puree Puree: Not tested   Solid       Solid: Within functional limits       Orbie Pyo [employer] [phone]  05/18/2013

## 2013-05-19 ENCOUNTER — Inpatient Hospital Stay (HOSPITAL_COMMUNITY): Payer: Medicare Other

## 2013-05-19 DIAGNOSIS — M069 Rheumatoid arthritis, unspecified: Secondary | ICD-10-CM

## 2013-05-19 DIAGNOSIS — I10 Essential (primary) hypertension: Secondary | ICD-10-CM

## 2013-05-19 LAB — CBC
HEMATOCRIT: 36.4 % (ref 36.0–46.0)
HEMOGLOBIN: 12 g/dL (ref 12.0–15.0)
MCH: 28.4 pg (ref 26.0–34.0)
MCHC: 33 g/dL (ref 30.0–36.0)
MCV: 86.3 fL (ref 78.0–100.0)
Platelets: 324 10*3/uL (ref 150–400)
RBC: 4.22 MIL/uL (ref 3.87–5.11)
RDW: 17 % — AB (ref 11.5–15.5)
WBC: 20.2 10*3/uL — ABNORMAL HIGH (ref 4.0–10.5)

## 2013-05-19 MED ORDER — LEVOFLOXACIN 500 MG PO TABS
500.0000 mg | ORAL_TABLET | Freq: Every day | ORAL | Status: DC
  Administered 2013-05-19 – 2013-05-22 (×4): 500 mg via ORAL
  Filled 2013-05-19 (×4): qty 1

## 2013-05-19 MED ORDER — HYDROCOD POLST-CHLORPHEN POLST 10-8 MG/5ML PO LQCR
5.0000 mL | Freq: Two times a day (BID) | ORAL | Status: DC | PRN
  Administered 2013-05-19 – 2013-05-22 (×3): 5 mL via ORAL
  Filled 2013-05-19 (×3): qty 5

## 2013-05-19 MED ORDER — FLUTICASONE PROPIONATE 50 MCG/ACT NA SUSP
2.0000 | Freq: Every day | NASAL | Status: DC
  Administered 2013-05-19 – 2013-05-22 (×4): 2 via NASAL
  Filled 2013-05-19: qty 16

## 2013-05-19 NOTE — Care Management Note (Unsigned)
    Page 1 of 2   05/19/2013     3:28:55 PM   CARE MANAGEMENT NOTE 05/19/2013  Patient:  Rebecca Hodges, Rebecca Hodges   Account Number:  [id number]  Date Initiated:  05/19/2013  Documentation initiated by:  Lorne Skeens  Subjective/Objective Assessment:   Patient was admitted with pneumonia and sepsis.  Resides at [facility].     Action/Plan:   Will follow for discharge needs pending PT/OT eval   Anticipated DC Date:     Anticipated DC Plan:  SKILLED NURSING FACILITY  In-house referral  Clinical Social Worker      DC Planning Services  CM consult      Choice offered to / List presented to:             Status of service:  In process, will continue to follow Medicare Important Message given?   (If response is "NO", the following Medicare IM given date fields will be blank) Date Medicare IM given:   Date Additional Medicare IM given:    Discharge Disposition:    Per UR Regulation:    If discussed at Long Length of Stay Meetings, dates discussed:    Comments:  05/19/13 [place], MSN, CM- Patient's insurance information was faxed to admitting and shared with CSW for SNF placement purposes.   05/19/13 [place], MSN, CM- Patient's daughter asked to speak with CM regarding discharge planning.  Patient's daughter was requesting information regarding discharge date.  CM explained that labs and testing were still pending and the results would be necessary to determine her IV antibiotic course prior to determining a discharge date.  Due to increased weakness, patient stated that she prefers to discharge to the SNF associated with [facility] prior to returning to her independent living apartment.  CSW was notified and PT/OT orders were requested from attending MD for possible SNF placement at discharge.  Patient is currently listed as self-pay, but states that she does have insurance.  Patient states that her other daughter will bring her  insurance card later today, which CM will fax to financial counselor to be added to the chart.

## 2013-05-19 NOTE — Progress Notes (Signed)
[facility] PROGRESS NOTE  Tawney Vanorman [facility] YNW:[id number] DOB: 1929-11-19 DOA: 05/16/2013 PCP: Pcp Not In System  Assessment/Plan: HCAP/Sinusitis - On Vancomycin and Cefepime for now. Leukocytosis improving. Remains afebrile overnight. Repeat cxr from this AM done. Per my own read, PNA appears to be improved. Add nasal steroid and cough suppressant. Sepsis - on admission, secondary to above Leukocytosis - secondary to sepsis with steroid use Hypokalemia  RA - continue Prednisone, hold Methotrexate  HTN - continue Metop, holding Diltiazem secondary to soft BP HLD - continue Zocor  Chronic cough - plan to obtain CD with CT chest from daughter and upload.  Colon cancer, s/p resection 6 months ago. Outpatient follow up with her oncologist in [place] (Dr. Cruzita Lederer) Diarrhea - Cdiff neg. Stool cx pending  Code Status: DNR Family Communication: Pt in room Disposition Plan: Pending  Consultants:    Procedures:    Antibiotics:  Vancymycin 05/17/13>>>  Cefepime 05/17/13>>>  HPI/Subjective: Reportedly has not been able to sleep overnight secondary to coughing. Feels "worn down" this AM.  Objective: Filed Vitals:   05/18/13 1811 05/18/13 2122 05/19/13 0130 05/19/13 0542  BP: 137/61 163/72 171/71 188/73  Pulse: 67 66 66 73  Temp: 97.7 F (36.5 C) 97.4 F (36.3 C) 98 F (36.7 C) 98.3 F (36.8 C)  TempSrc: Oral Oral Oral Oral  Resp: 20 20 20 20   Height:      Weight:      SpO2: 96% 96% 98% 98%    Intake/Output Summary (Last 24 hours) at 05/19/13 0858 Last data filed at 05/18/13 2046  Gross per 24 hour  Intake    120 ml  Output      0 ml  Net    120 ml   Filed Weights   05/17/13 1100  Weight: 67.6 kg (149 lb 0.5 oz)    Exam:   General:  Awake, in nad  Cardiovascular: regular, s1, s2  Respiratory: normal resp effort, no wheezing  Abdomen: soft, nondistended  Musculoskeletal: perfused, no clubbing   Data Reviewed: Basic Metabolic Panel:  Recent  Labs Lab 05/16/13 2230 05/17/13 1141 05/18/13 0528  NA 135* 136* 139  K 3.0* 2.6* 3.9  CL 92* 97 102  CO2 26 23 23   GLUCOSE 116* 95 81  BUN 14 11 10   CREATININE 0.60 0.56 0.52  CALCIUM 9.3 7.6* 8.3*   Liver Function Tests:  Recent Labs Lab 05/16/13 2230  AST 25  ALT 17  ALKPHOS 86  BILITOT 0.4  PROT 6.5  ALBUMIN 3.6   No results found for this basename: LIPASE, AMYLASE,  in the last 168 hours No results found for this basename: AMMONIA,  in the last 168 hours CBC:  Recent Labs Lab 05/16/13 2230 05/17/13 1141 05/18/13 0528  WBC 23.1* 32.0* 26.1*  NEUTROABS 21.5* 29.1*  --   HGB 13.1 11.5* 11.9*  HCT 39.0 34.4* 35.6*  MCV 85.5 84.9 85.0  PLT 371 325 318   Cardiac Enzymes: No results found for this basename: CKTOTAL, CKMB, CKMBINDEX, TROPONINI,  in the last 168 hours BNP (last 3 results) No results found for this basename: PROBNP,  in the last 8760 hours CBG:  Recent Labs Lab 05/16/13 2226 05/18/13 0131  GLUCAP 115* 78    Recent Results (from the past 240 hour(s))  CULTURE, BLOOD (ROUTINE X 2)     Status: None   Collection Time    05/16/13 10:20 PM      Result Value Ref Range Status   Specimen  Description BLOOD LEFT ARM   Final   Special Requests BOTTLES DRAWN AEROBIC AND ANAEROBIC 5ML EACH   Final   Culture  Setup Time     Final   Value: 05/17/2013 09:54     Performed at [facility]   Culture     Final   Value:        BLOOD CULTURE RECEIVED NO GROWTH TO DATE CULTURE WILL BE HELD FOR 5 DAYS BEFORE ISSUING A FINAL NEGATIVE REPORT     Performed at [facility]   Report Status PENDING   Incomplete  CULTURE, BLOOD (ROUTINE X 2)     Status: None   Collection Time    05/16/13 10:25 PM      Result Value Ref Range Status   Specimen Description BLOOD LEFT FOREARM   Final   Special Requests BOTTLES DRAWN AEROBIC AND ANAEROBIC 5ML EACH   Final   Culture  Setup Time     Final   Value: 05/17/2013 09:54     Performed at [facility]    Culture     Final   Value:        BLOOD CULTURE RECEIVED NO GROWTH TO DATE CULTURE WILL BE HELD FOR 5 DAYS BEFORE ISSUING A FINAL NEGATIVE REPORT     Performed at [facility]   Report Status PENDING   Incomplete  URINE CULTURE     Status: None   Collection Time    05/16/13 10:50 PM      Result Value Ref Range Status   Specimen Description URINE, CATHETERIZED   Final   Special Requests NONE   Final   Culture  Setup Time     Final   Value: 05/17/2013 09:56     Performed at [facility]     Final   Value: NO GROWTH     Performed at [facility]   Culture     Final   Value: NO GROWTH     Performed at [facility]   Report Status 05/18/2013 FINAL   Final  CULTURE, EXPECTORATED SPUTUM-ASSESSMENT     Status: None   Collection Time    05/17/13  3:35 PM      Result Value Ref Range Status   Specimen Description SPUTUM   Final   Special Requests NONE   Final   Sputum evaluation     Final   Value: THIS SPECIMEN IS ACCEPTABLE. RESPIRATORY CULTURE REPORT TO FOLLOW.   Report Status 05/17/2013 FINAL   Final  CULTURE, RESPIRATORY (NON-EXPECTORATED)     Status: None   Collection Time    05/17/13  3:35 PM      Result Value Ref Range Status   Specimen Description SPUTUM   Final   Special Requests NONE   Final   Gram Stain     Final   Value: MODERATE WBC PRESENT,BOTH PMN AND MONONUCLEAR     NO SQUAMOUS EPITHELIAL CELLS SEEN     NO ORGANISMS SEEN     Performed at [facility]   Culture PENDING   Incomplete   Report Status PENDING   Incomplete  CLOSTRIDIUM DIFFICILE BY PCR     Status: None   Collection Time    05/18/13  2:52 PM      Result Value Ref Range Status   C difficile by pcr NEGATIVE  NEGATIVE Final     Studies: No results found.  Scheduled Meds: . aspirin  EC  81 mg Oral Daily  . ceFEPime (MAXIPIME) IV  2 g Intravenous Q24H  . diltiazem  240 mg Oral Daily  . enoxaparin (LOVENOX) injection  40 mg Subcutaneous Daily  .  metoprolol succinate  75 mg Oral Daily  . predniSONE  7.5 mg Oral Q breakfast  . simvastatin  10 mg Oral q1800  . vancomycin  500 mg Intravenous Q12H   Continuous Infusions: . sodium chloride 100 mL/hr at 05/19/13 0139    Principal Problem:   HCAP (healthcare-associated pneumonia) Active Problems:   CAP (community acquired pneumonia)   Colon cancer   Rheumatoid arthritis   HTN (hypertension)   HLD (hyperlipidemia)  Time spent: 61min  [facility], [facility] Pager [phone]. If 7PM-7AM, please contact night-coverage at [email or web address], password [facility] 05/19/2013, 8:58 AM  LOS: 3 days

## 2013-05-20 LAB — CBC WITH DIFFERENTIAL/PLATELET
BASOS PCT: 0 % (ref 0–1)
Basophils Absolute: 0 10*3/uL (ref 0.0–0.1)
EOS ABS: 0.4 10*3/uL (ref 0.0–0.7)
Eosinophils Relative: 3 % (ref 0–5)
HCT: 36 % (ref 36.0–46.0)
Hemoglobin: 12 g/dL (ref 12.0–15.0)
Lymphocytes Relative: 7 % — ABNORMAL LOW (ref 12–46)
Lymphs Abs: 1 10*3/uL (ref 0.7–4.0)
MCH: 28.3 pg (ref 26.0–34.0)
MCHC: 33.3 g/dL (ref 30.0–36.0)
MCV: 84.9 fL (ref 78.0–100.0)
Monocytes Absolute: 1.7 10*3/uL — ABNORMAL HIGH (ref 0.1–1.0)
Monocytes Relative: 12 % (ref 3–12)
NEUTROS ABS: 11.4 10*3/uL — AB (ref 1.7–7.7)
NEUTROS PCT: 78 % — AB (ref 43–77)
Platelets: 330 10*3/uL (ref 150–400)
RBC: 4.24 MIL/uL (ref 3.87–5.11)
RDW: 16.8 % — AB (ref 11.5–15.5)
WBC: 14.5 10*3/uL — ABNORMAL HIGH (ref 4.0–10.5)

## 2013-05-20 LAB — BASIC METABOLIC PANEL
BUN: 5 mg/dL — ABNORMAL LOW (ref 6–23)
CO2: 24 mEq/L (ref 19–32)
CREATININE: 0.46 mg/dL — AB (ref 0.50–1.10)
Calcium: 8.3 mg/dL — ABNORMAL LOW (ref 8.4–10.5)
Chloride: 96 mEq/L (ref 96–112)
Glucose, Bld: 86 mg/dL (ref 70–99)
POTASSIUM: 2.9 meq/L — AB (ref 3.7–5.3)
Sodium: 134 mEq/L — ABNORMAL LOW (ref 137–147)

## 2013-05-20 LAB — CULTURE, RESPIRATORY W GRAM STAIN: Culture: NORMAL

## 2013-05-20 LAB — CULTURE, RESPIRATORY

## 2013-05-20 LAB — MAGNESIUM: Magnesium: 1.3 mg/dL — ABNORMAL LOW (ref 1.5–2.5)

## 2013-05-20 MED ORDER — BENZONATATE 100 MG PO CAPS
100.0000 mg | ORAL_CAPSULE | Freq: Three times a day (TID) | ORAL | Status: DC | PRN
  Administered 2013-05-20 – 2013-05-22 (×5): 100 mg via ORAL
  Filled 2013-05-20 (×6): qty 1

## 2013-05-20 MED ORDER — POTASSIUM CHLORIDE CRYS ER 20 MEQ PO TBCR
40.0000 meq | EXTENDED_RELEASE_TABLET | Freq: Two times a day (BID) | ORAL | Status: AC
  Administered 2013-05-20 (×2): 40 meq via ORAL
  Filled 2013-05-20 (×2): qty 2

## 2013-05-20 NOTE — Evaluation (Signed)
Physical Therapy Evaluation Patient Details Name: Rebecca Hodges MRN: [id number] DOB: [birth year] Today's Date: 05/20/2013   History of Present Illness  pt presents with HCAP.    Clinical Impression  Pt movign slowly and limited by activity tolerance and SOB.  Pt's O2 sats on RA 92-93% during ambulation.  RN made aware.  Plan for pt to return to SNF at [facility] prior to returning to her [place] apartment.  Will continue to follow.      Follow Up Recommendations SNF    Equipment Recommendations  None recommended by PT    Recommendations for Other Services       Precautions / Restrictions Precautions Precautions: Fall Restrictions Weight Bearing Restrictions: No      Mobility  Bed Mobility Overal bed mobility: Needs Assistance Bed Mobility: Supine to Sit     Supine to sit: Supervision;HOB elevated     General bed mobility comments: pt needs HOB elevated and use of bed rail to complete without A.    Transfers Overall transfer level: Needs assistance Equipment used: Rolling walker (2 wheeled) Transfers: Sit to/from Stand Sit to Stand: Min guard         General transfer comment: pt needs use of UEs to complete.    Ambulation/Gait Ambulation/Gait assistance: Min guard Ambulation Distance (Feet): 60 Feet Assistive device: Rolling walker (2 wheeled) Gait Pattern/deviations: Step-through pattern;Decreased stride length;Trunk flexed   Gait velocity interpretation: Below normal speed for age/gender General Gait Details: pt with labroed breathing during ambulation, though O2 sats remained 92-93%.    Stairs            Wheelchair Mobility    Modified Rankin (Stroke Patients Only)       Balance Overall balance assessment: Needs assistance         Standing balance support: Single extremity supported;During functional activity Standing balance-Leahy Scale: Poor Standing balance comment: pt able to maintain standing but requires single UE  support or leaning against sink during hand hygiene.                               Pertinent Vitals/Pain Denied pain.      Home Living Family/patient expects to be discharged to:: Skilled nursing facility                 Additional Comments: pt is a resident of [place] and plans to retrun to their rehab.      Prior Function Level of Independence: Independent with assistive device(s)               Hand Dominance        Extremity/Trunk Assessment   Upper Extremity Assessment: Generalized weakness           Lower Extremity Assessment: Generalized weakness      Cervical / Trunk Assessment: Kyphotic  Communication   Communication: No difficulties  Cognition Arousal/Alertness: Awake/alert Behavior During Therapy: WFL for tasks assessed/performed Overall Cognitive Status: Within Functional Limits for tasks assessed                      General Comments      Exercises        Assessment/Plan    PT Assessment Patient needs continued PT services  PT Diagnosis Difficulty walking   PT Problem List Decreased strength;Decreased activity tolerance;Decreased balance;Decreased mobility;Decreased knowledge of use of DME;Cardiopulmonary status limiting activity  PT Treatment Interventions DME instruction;Gait training;Functional  mobility training;Therapeutic activities;Therapeutic exercise;Balance training;Patient/family education   PT Goals (Current goals can be found in the Care Plan section) Acute Rehab PT Goals Patient Stated Goal: Back to Independent Living PT Goal Formulation: With patient Time For Goal Achievement: 06/03/13 Potential to Achieve Goals: Good    Frequency Min 3X/week   Barriers to discharge        Co-evaluation               End of Session Equipment Utilized During Treatment: Gait belt Activity Tolerance: Patient tolerated treatment well Patient left: in bed;with call bell/phone within  reach;with family/visitor present (Sitting EOB) Nurse Communication: Mobility status (O2 sats)         Time: 3428-7681 PT Time Calculation (min): 24 min   Charges:   PT Evaluation $Initial PT Evaluation Tier I: 1 Procedure PT Treatments $Gait Training: 8-22 mins   PT G CodesCatarina Hartshorn, [place] 05/20/2013, 2:01 PM

## 2013-05-20 NOTE — Progress Notes (Signed)
UR COMPLETED  

## 2013-05-20 NOTE — Progress Notes (Signed)
Occupational Therapy Evaluation Patient Details Name: Rebecca Hodges MRN: [id number] DOB: 1930-01-08 Today's Date: 05/20/2013    History of Present Illness pt presents with HCAP.     Clinical Impression   Pt admitted with above.  She fatigues easily during mobility and standing tasks.  Pt plans to return to SNF at [facility] prior to returning to [place]. Will continue to follow to address below problem list.    Follow Up Recommendations  SNF;Supervision/Assistance - 24 hour    Equipment Recommendations  None recommended by OT    Recommendations for Other Services       Precautions / Restrictions Precautions Precautions: Fall Restrictions Weight Bearing Restrictions: No      Mobility Bed Mobility Overal bed mobility: Needs Assistance Bed Mobility: Supine to Sit     Supine to sit: Supervision;HOB elevated     General bed mobility comments: pt needs HOB elevated and use of bed rail to complete without A.    Transfers Overall transfer level: Needs assistance Equipment used: Rolling walker (2 wheeled) Transfers: Sit to/from Stand Sit to Stand: Min guard         General transfer comment: pt needs use of UEs to complete.      Balance Overall balance assessment: Needs assistance         Standing balance support: Bilateral upper extremity supported Standing balance-Leahy Scale: Poor Standing balance comment: bil UE supported on RW                            ADL Overall ADL's : Needs assistance/impaired     Grooming: Wash/dry face;Wash/dry hands;Standing;Minimal assistance                   Toilet Transfer: Minimal assistance;Ambulation;Cueing for safety;RW           Functional mobility during ADLs: Minimal assistance;Rolling walker General ADL Comments: Pt fatigues easily during functional mobility and standing tasks.  Reports she feels weaker than normal.     Vision                     Perception      Praxis      Pertinent Vitals/Pain No c/o pain.SpO2 94 % on RA     Hand Dominance     Extremity/Trunk Assessment Upper Extremity Assessment Upper Extremity Assessment: Generalized weakness   Lower Extremity Assessment Lower Extremity Assessment: Generalized weakness   Cervical / Trunk Assessment Cervical / Trunk Assessment: Kyphotic   Communication Communication Communication: No difficulties   Cognition Arousal/Alertness: Awake/alert Behavior During Therapy: WFL for tasks assessed/performed Overall Cognitive Status: Within Functional Limits for tasks assessed                     General Comments       Exercises       Shoulder Instructions      Home Living Family/patient expects to be discharged to:: Skilled nursing facility                                 Additional Comments: pt is a resident of [facility] and plans to retrun to their rehab.        Prior Functioning/Environment Level of Independence: Independent with assistive device(s)             OT Diagnosis: Generalized weakness   OT Problem  List: Decreased strength;Decreased activity tolerance;Impaired balance (sitting and/or standing);Decreased knowledge of use of DME or AE   OT Treatment/Interventions: Self-care/ADL training;DME and/or AE instruction;Therapeutic activities;Patient/family education;Balance training    OT Goals(Current goals can be found in the care plan section) Acute Rehab OT Goals Patient Stated Goal: Back to Independent Living OT Goal Formulation: With patient Time For Goal Achievement: 05/27/13 Potential to Achieve Goals: Good  OT Frequency: Min 2X/week   Barriers to D/C:            Co-evaluation              End of Session Equipment Utilized During Treatment: Gait belt;Rolling walker  Activity Tolerance: Patient limited by fatigue Patient left: in chair;with call bell/phone within reach;with family/visitor present    Time: 7824-2353 OT Time Calculation (min): 16 min Charges:  OT General Charges $OT Visit: 1 Procedure OT Evaluation $Initial OT Evaluation Tier I: 1 Procedure OT Treatments $Self Care/Home Management : 8-22 mins G-Codes:    Darrol Jump 05/20/2013, 3:37 PM  05/20/2013 Darrol Jump OTR/L Pager [phone] Office [phone]

## 2013-05-20 NOTE — Clinical Documentation Improvement (Signed)
Possible Clinical Conditions?    Encephalopathy (describe type if known)                       Anoxic                       Septic                       Alcoholic                        Hepatic                       Hypertensive                       Metabolic                       Toxic Drug induced delirium  Hyponatremia / Hypernatremia Poisoning / Overdose Other Condition Cannot Clinically Determine    Risk Factors: AMS, confusion noted per 3/31 ED note.  Thank You, Theron Arista, Clinical Documentation Specialist:  [phone]  [facility]

## 2013-05-20 NOTE — Progress Notes (Addendum)
CSW left a message for Peggy at [facility] at [place] concerning possible d/c tomorrow.   CSW will send Pt information to facility for possible weekend d/c.   CSW will continue to follow Pt for d/c planning.    [facility]  [address]N 1[address]16;  [phone] Phone: [phone]

## 2013-05-20 NOTE — Progress Notes (Signed)
CRITICAL VALUE ALERT  Critical value received:  K+ 2.9  Date of notification:  05/20/13  Time of notification:  0650  Critical value read back:yes  Nurse who received alert:  Caryl Pina   MD notified (1st page):  Wyline Copas  Time of first page:  0700  MD notified (2nd page):  Time of second page:  Responding MD:   Time MD responded:

## 2013-05-20 NOTE — Progress Notes (Addendum)
Clinical Social Work Department CLINICAL SOCIAL WORK PLACEMENT NOTE 05/20/2013  Patient:  Rebecca Hodges, Rebecca Hodges  Account Number:  [id number] Admit date:  05/16/2013  Clinical Social Worker:  Pete Pelt, CLINICAL SOCIAL WORKER  Date/time:  05/20/2013 04:38 PM  Clinical Social Work is seeking post-discharge placement for this patient at the following level of care:   SKILLED NURSING   (*CSW will update this form in Epic as items are completed)   05/18/2013  Patient/family provided with [facility] Department of Clinical Social Work's list of facilities offering this level of care within the geographic area requested by the patient (or if unable, by the patient's family).  05/18/2013  Patient/family informed of their freedom to choose among providers that offer the needed level of care, that participate in Medicare, Medicaid or managed care program needed by the patient, have an available bed and are willing to accept the patient.  05/18/2013  Patient/family informed of [facility]' ownership interest in [facility], as well as of the fact that they are under no obligation to receive care at this facility.  PASARR submitted to EDS on 05/18/2013 PASARR number received from EDS on 05/18/2013  FL2 transmitted to all facilities in geographic area requested by pt/family on  05/20/2013 FL2 transmitted to all facilities within larger geographic area on 05/20/2013  Patient informed that his/her managed care company has contracts with or will negotiate with  certain facilities, including the following:     Patient/family informed of bed offers received:  05/20/2013 Patient chooses bed at [facility] Physician recommends and patient chooses bed at    Patient to be transferred to [facility] at [facility] on 05/23/23- Blima Rich, [place]   Patient to be transferred to facility by [facility]- Blima Rich, LCSWA  The following physician request were entered in  Epic:   Additional Comments:  [place] 1[address]16;  6N1-16 Phone: [phone]

## 2013-05-20 NOTE — Progress Notes (Signed)
[facility] PROGRESS NOTE  Rebecca Hodges GYF:[id number] DOB: 04-22-1929 DOA: 05/16/2013 PCP: Pcp Not In System  Assessment/Plan: HCAP/Sinusitis - On Levaquin now. Leukocytosis improving. Remains afebrile overnight. Repeat cxr from this AM done. Per my own read, PNA appears to be improved slightly. Added nasal steroid and cough suppressant. Cont flutter valve Sepsis - on admission, secondary to above Leukocytosis - secondary to sepsis with steroid use-improving nicely Hypokalemia  RA - continue Prednisone, hold Methotrexate  HTN - continue Metop, holding Diltiazem secondary to soft BP HLD - continue Zocor  Chronic cough - plan to obtain CD with CT chest from daughter and upload.  Colon cancer, s/p resection 6 months ago. Outpatient follow up with her oncologist in [place] (Dr. Cruzita Lederer) Diarrhea - Cdiff neg. Stool cx pending  Code Status: DNR Family Communication: Pt in room Disposition Plan: Pending  Consultants:    Procedures:    Antibiotics:  Vancymycin 05/17/13>>>05/19/13  Cefepime 05/17/13>>>05/19/13  Levaquin 05/19/13>>>  HPI/Subjective: Still coughing with sputum production. No acute events noted overnight  Objective: Filed Vitals:   05/19/13 2157 05/20/13 0144 05/20/13 0543 05/20/13 1029  BP: 183/78 180/79 149/73 154/79  Pulse: 67 78 75 74  Temp: 98.2 F (36.8 C) 98.8 F (37.1 C) 98.6 F (37 C) 98.3 F (36.8 C)  TempSrc: Oral Oral Oral Oral  Resp: 20 20 20 20   Height:      Weight:      SpO2: 97% 94% 92% 98%    Intake/Output Summary (Last 24 hours) at 05/20/13 1259 Last data filed at 05/20/13 1029  Gross per 24 hour  Intake    480 ml  Output      0 ml  Net    480 ml   Filed Weights   05/17/13 1100  Weight: 67.6 kg (149 lb 0.5 oz)    Exam:   General:  Awake, in nad  Cardiovascular: regular, s1, s2  Respiratory: normal resp effort, no wheezing  Abdomen: soft, nondistended  Musculoskeletal: perfused, no clubbing   Data  Reviewed: Basic Metabolic Panel:  Recent Labs Lab 05/16/13 2230 05/17/13 1141 05/18/13 0528 05/20/13 0500 05/20/13 0535  NA 135* 136* 139  --  134*  K 3.0* 2.6* 3.9  --  2.9*  CL 92* 97 102  --  96  CO2 26 23 23   --  24  GLUCOSE 116* 95 81  --  86  BUN 14 11 10   --  5*  CREATININE 0.60 0.56 0.52  --  0.46*  CALCIUM 9.3 7.6* 8.3*  --  8.3*  MG  --   --   --  1.3*  --    Liver Function Tests:  Recent Labs Lab 05/16/13 2230  AST 25  ALT 17  ALKPHOS 86  BILITOT 0.4  PROT 6.5  ALBUMIN 3.6   No results found for this basename: LIPASE, AMYLASE,  in the last 168 hours No results found for this basename: AMMONIA,  in the last 168 hours CBC:  Recent Labs Lab 05/16/13 2230 05/17/13 1141 05/18/13 0528 05/19/13 1025 05/20/13 0535  WBC 23.1* 32.0* 26.1* 20.2* 14.5*  NEUTROABS 21.5* 29.1*  --   --  11.4*  HGB 13.1 11.5* 11.9* 12.0 12.0  HCT 39.0 34.4* 35.6* 36.4 36.0  MCV 85.5 84.9 85.0 86.3 84.9  PLT 371 325 318 324 330   Cardiac Enzymes: No results found for this basename: CKTOTAL, CKMB, CKMBINDEX, TROPONINI,  in the last 168 hours BNP (last 3 results) No results found  for this basename: PROBNP,  in the last 8760 hours CBG:  Recent Labs Lab 05/16/13 2226 05/18/13 0131  GLUCAP 115* 78    Recent Results (from the past 240 hour(s))  CULTURE, BLOOD (ROUTINE X 2)     Status: None   Collection Time    05/16/13 10:20 PM      Result Value Ref Range Status   Specimen Description BLOOD LEFT ARM   Final   Special Requests BOTTLES DRAWN AEROBIC AND ANAEROBIC 5ML EACH   Final   Culture  Setup Time     Final   Value: 05/17/2013 09:54     Performed at [facility]   Culture     Final   Value:        BLOOD CULTURE RECEIVED NO GROWTH TO DATE CULTURE WILL BE HELD FOR 5 DAYS BEFORE ISSUING A FINAL NEGATIVE REPORT     Performed at [facility]   Report Status PENDING   Incomplete  CULTURE, BLOOD (ROUTINE X 2)     Status: None   Collection Time    05/16/13  10:25 PM      Result Value Ref Range Status   Specimen Description BLOOD LEFT FOREARM   Final   Special Requests BOTTLES DRAWN AEROBIC AND ANAEROBIC 5ML EACH   Final   Culture  Setup Time     Final   Value: 05/17/2013 09:54     Performed at [facility]   Culture     Final   Value:        BLOOD CULTURE RECEIVED NO GROWTH TO DATE CULTURE WILL BE HELD FOR 5 DAYS BEFORE ISSUING A FINAL NEGATIVE REPORT     Performed at [facility]   Report Status PENDING   Incomplete  URINE CULTURE     Status: None   Collection Time    05/16/13 10:50 PM      Result Value Ref Range Status   Specimen Description URINE, CATHETERIZED   Final   Special Requests NONE   Final   Culture  Setup Time     Final   Value: 05/17/2013 09:56     Performed at [place]     Final   Value: NO GROWTH     Performed at [facility]   Culture     Final   Value: NO GROWTH     Performed at [facility]   Report Status 05/18/2013 FINAL   Final  CULTURE, EXPECTORATED SPUTUM-ASSESSMENT     Status: None   Collection Time    05/17/13  3:35 PM      Result Value Ref Range Status   Specimen Description SPUTUM   Final   Special Requests NONE   Final   Sputum evaluation     Final   Value: THIS SPECIMEN IS ACCEPTABLE. RESPIRATORY CULTURE REPORT TO FOLLOW.   Report Status 05/17/2013 FINAL   Final  CULTURE, RESPIRATORY (NON-EXPECTORATED)     Status: None   Collection Time    05/17/13  3:35 PM      Result Value Ref Range Status   Specimen Description SPUTUM   Final   Special Requests NONE   Final   Gram Stain     Final   Value: MODERATE WBC PRESENT,BOTH PMN AND MONONUCLEAR     NO SQUAMOUS EPITHELIAL CELLS SEEN     NO ORGANISMS SEEN     Performed at [facility]  Culture     Final   Value: NORMAL OROPHARYNGEAL FLORA     Performed at [facility]   Report Status 05/20/2013 FINAL   Final  CLOSTRIDIUM DIFFICILE BY PCR     Status: None   Collection Time     05/18/13  2:52 PM      Result Value Ref Range Status   C difficile by pcr NEGATIVE  NEGATIVE Final  STOOL CULTURE     Status: None   Collection Time    05/18/13  2:52 PM      Result Value Ref Range Status   Specimen Description STOOL   Final   Special Requests NONE   Final   Culture     Final   Value: NO SUSPICIOUS COLONIES, CONTINUING TO HOLD     Note: REDUCED NORMAL FLORA PRESENT     Performed at [facility]   Report Status PENDING   Incomplete     Studies: Dg Chest Port 1 View  05/19/2013   CLINICAL DATA:  Shortness of breath.  EXAM: PORTABLE CHEST - 1 VIEW  COMPARISON:  DG CHEST 1V PORT dated 05/16/2013  FINDINGS: Mediastinum and hilar structures normal. Persistent infiltrate right lower lobe is present. No significant change noted. No pleural effusion or pneumothorax. Stable cardiomegaly with normal pulmonary vascularity. No acute osseous abnormality.  IMPRESSION: Persistent unchanged right lower lobe infiltrate consistent with pneumonia.   Electronically Signed   By: Marcello Moores  Register   On: 05/19/2013 12:18    Scheduled Meds: . aspirin EC  81 mg Oral Daily  . diltiazem  240 mg Oral Daily  . enoxaparin (LOVENOX) injection  40 mg Subcutaneous Daily  . fluticasone  2 spray Each Nare Daily  . levofloxacin  500 mg Oral Daily  . metoprolol succinate  75 mg Oral Daily  . potassium chloride  40 mEq Oral BID  . predniSONE  7.5 mg Oral Q breakfast  . simvastatin  10 mg Oral q1800   Continuous Infusions: . sodium chloride 100 mL/hr at 05/20/13 1019    Principal Problem:   HCAP (healthcare-associated pneumonia) Active Problems:   CAP (community acquired pneumonia)   Colon cancer   Rheumatoid arthritis   HTN (hypertension)   HLD (hyperlipidemia)  Time spent: 85min  [facility], [facility] Pager [phone]. If 7PM-7AM, please contact night-coverage at [email or web address], password [facility] 05/20/2013, 12:59 PM  LOS: 4 days

## 2013-05-21 LAB — CBC WITH DIFFERENTIAL/PLATELET
BASOS ABS: 0 10*3/uL (ref 0.0–0.1)
BASOS PCT: 0 % (ref 0–1)
EOS ABS: 0.5 10*3/uL (ref 0.0–0.7)
Eosinophils Relative: 3 % (ref 0–5)
HCT: 36.8 % (ref 36.0–46.0)
Hemoglobin: 12.2 g/dL (ref 12.0–15.0)
LYMPHS PCT: 14 % (ref 12–46)
Lymphs Abs: 2.2 10*3/uL (ref 0.7–4.0)
MCH: 28.4 pg (ref 26.0–34.0)
MCHC: 33.2 g/dL (ref 30.0–36.0)
MCV: 85.8 fL (ref 78.0–100.0)
MONO ABS: 1.2 10*3/uL — AB (ref 0.1–1.0)
Monocytes Relative: 8 % (ref 3–12)
NEUTROS PCT: 75 % (ref 43–77)
Neutro Abs: 11.5 10*3/uL — ABNORMAL HIGH (ref 1.7–7.7)
PLATELETS: 356 10*3/uL (ref 150–400)
RBC: 4.29 MIL/uL (ref 3.87–5.11)
RDW: 16.8 % — ABNORMAL HIGH (ref 11.5–15.5)
WBC: 15.4 10*3/uL — ABNORMAL HIGH (ref 4.0–10.5)

## 2013-05-21 NOTE — Progress Notes (Signed)
[facility] PROGRESS NOTE  Rebecca Hodges SNK:[id number] DOB: 16-Oct-1929 DOA: 05/16/2013 PCP: Pcp Not In System  Assessment/Plan: HCAP/Sinusitis - On Levaquin now. Leukocytosis much improved, holding steady (pt is on chronic prednisone for RA). Remains afebrile overnight.  Added nasal steroid and cough suppressant. Cont flutter valve. Pt still feels "worn out." Will obtain repeat CXR in AM. Sepsis - on admission, secondary to above Leukocytosis - secondary to PNA and with steroid use-improved Hypokalemia  RA - continue Prednisone, holding Methotrexate  HTN - continue Metop, holding Diltiazem secondary to soft BP HLD - continue Zocor  Chronic cough - tessalon perles and tussionex per above.  Colon cancer, s/p resection 6 months ago. Outpatient follow up with her oncologist in [place] (Dr. Cruzita Lederer) Diarrhea - Cdiff neg. Stool cx pending  Code Status: DNR Family Communication: Pt in room Disposition Plan: Pending  Consultants:    Procedures:    Antibiotics:  Vancymycin 05/17/13>>>05/19/13  Cefepime 05/17/13>>>05/19/13  Levaquin 05/19/13>>>  HPI/Subjective: Still coughing with sputum production. Feels "worn out."  Objective: Filed Vitals:   05/21/13 0134 05/21/13 0632 05/21/13 1054 05/21/13 1057  BP: 187/80 169/72 173/78 125/47  Pulse: 75 67 73 93  Temp: 99.2 F (37.3 C) 98.1 F (36.7 C) 97.8 F (36.6 C) 99.7 F (37.6 C)  TempSrc: Oral Oral Oral Oral  Resp: 20 22 20 18   Height:      Weight:      SpO2: 98% 96% 96% 97%   No intake or output data in the 24 hours ending 05/21/13 1114 Filed Weights   05/17/13 1100  Weight: 67.6 kg (149 lb 0.5 oz)    Exam:   General:  Awake, in nad  Cardiovascular: regular, s1, s2  Respiratory: normal resp effort, no wheezing  Abdomen: soft, nondistended  Musculoskeletal: perfused, no clubbing   Data Reviewed: Basic Metabolic Panel:  Recent Labs Lab 05/16/13 2230 05/17/13 1141 05/18/13 0528 05/20/13 0500  05/20/13 0535  NA 135* 136* 139  --  134*  K 3.0* 2.6* 3.9  --  2.9*  CL 92* 97 102  --  96  CO2 26 23 23   --  24  GLUCOSE 116* 95 81  --  86  BUN 14 11 10   --  5*  CREATININE 0.60 0.56 0.52  --  0.46*  CALCIUM 9.3 7.6* 8.3*  --  8.3*  MG  --   --   --  1.3*  --    Liver Function Tests:  Recent Labs Lab 05/16/13 2230  AST 25  ALT 17  ALKPHOS 86  BILITOT 0.4  PROT 6.5  ALBUMIN 3.6   No results found for this basename: LIPASE, AMYLASE,  in the last 168 hours No results found for this basename: AMMONIA,  in the last 168 hours CBC:  Recent Labs Lab 05/16/13 2230 05/17/13 1141 05/18/13 0528 05/19/13 1025 05/20/13 0535 05/21/13 0538  WBC 23.1* 32.0* 26.1* 20.2* 14.5* 15.4*  NEUTROABS 21.5* 29.1*  --   --  11.4* 11.5*  HGB 13.1 11.5* 11.9* 12.0 12.0 12.2  HCT 39.0 34.4* 35.6* 36.4 36.0 36.8  MCV 85.5 84.9 85.0 86.3 84.9 85.8  PLT 371 325 318 324 330 356   Cardiac Enzymes: No results found for this basename: CKTOTAL, CKMB, CKMBINDEX, TROPONINI,  in the last 168 hours BNP (last 3 results) No results found for this basename: PROBNP,  in the last 8760 hours CBG:  Recent Labs Lab 05/16/13 2226 05/18/13 0131  GLUCAP 115* 78  Recent Results (from the past 240 hour(s))  CULTURE, BLOOD (ROUTINE X 2)     Status: None   Collection Time    05/16/13 10:20 PM      Result Value Ref Range Status   Specimen Description BLOOD LEFT ARM   Final   Special Requests BOTTLES DRAWN AEROBIC AND ANAEROBIC 5ML EACH   Final   Culture  Setup Time     Final   Value: 05/17/2013 09:54     Performed at [facility]   Culture     Final   Value:        BLOOD CULTURE RECEIVED NO GROWTH TO DATE CULTURE WILL BE HELD FOR 5 DAYS BEFORE ISSUING A FINAL NEGATIVE REPORT     Performed at [facility]   Report Status PENDING   Incomplete  CULTURE, BLOOD (ROUTINE X 2)     Status: None   Collection Time    05/16/13 10:25 PM      Result Value Ref Range Status   Specimen  Description BLOOD LEFT FOREARM   Final   Special Requests BOTTLES DRAWN AEROBIC AND ANAEROBIC 5ML EACH   Final   Culture  Setup Time     Final   Value: 05/17/2013 09:54     Performed at [facility]   Culture     Final   Value:        BLOOD CULTURE RECEIVED NO GROWTH TO DATE CULTURE WILL BE HELD FOR 5 DAYS BEFORE ISSUING A FINAL NEGATIVE REPORT     Performed at [facility]   Report Status PENDING   Incomplete  URINE CULTURE     Status: None   Collection Time    05/16/13 10:50 PM      Result Value Ref Range Status   Specimen Description URINE, CATHETERIZED   Final   Special Requests NONE   Final   Culture  Setup Time     Final   Value: 05/17/2013 09:56     Performed at [facility]     Final   Value: NO GROWTH     Performed at [facility]   Culture     Final   Value: NO GROWTH     Performed at [facility]   Report Status 05/18/2013 FINAL   Final  CULTURE, EXPECTORATED SPUTUM-ASSESSMENT     Status: None   Collection Time    05/17/13  3:35 PM      Result Value Ref Range Status   Specimen Description SPUTUM   Final   Special Requests NONE   Final   Sputum evaluation     Final   Value: THIS SPECIMEN IS ACCEPTABLE. RESPIRATORY CULTURE REPORT TO FOLLOW.   Report Status 05/17/2013 FINAL   Final  CULTURE, RESPIRATORY (NON-EXPECTORATED)     Status: None   Collection Time    05/17/13  3:35 PM      Result Value Ref Range Status   Specimen Description SPUTUM   Final   Special Requests NONE   Final   Gram Stain     Final   Value: MODERATE WBC PRESENT,BOTH PMN AND MONONUCLEAR     NO SQUAMOUS EPITHELIAL CELLS SEEN     NO ORGANISMS SEEN     Performed at [facility]   Culture     Final   Value: NORMAL OROPHARYNGEAL FLORA     Performed at [facility]   Report Status 05/20/2013  FINAL   Final  CLOSTRIDIUM DIFFICILE BY PCR     Status: None   Collection Time    05/18/13  2:52 PM      Result Value Ref Range Status    C difficile by pcr NEGATIVE  NEGATIVE Final  STOOL CULTURE     Status: None   Collection Time    05/18/13  2:52 PM      Result Value Ref Range Status   Specimen Description STOOL   Final   Special Requests NONE   Final   Culture     Final   Value: NO SUSPICIOUS COLONIES, CONTINUING TO HOLD     Note: REDUCED NORMAL FLORA PRESENT     Performed at [facility]   Report Status PENDING   Incomplete     Studies: Dg Chest Port 1 View  05/19/2013   CLINICAL DATA:  Shortness of breath.  EXAM: PORTABLE CHEST - 1 VIEW  COMPARISON:  DG CHEST 1V PORT dated 05/16/2013  FINDINGS: Mediastinum and hilar structures normal. Persistent infiltrate right lower lobe is present. No significant change noted. No pleural effusion or pneumothorax. Stable cardiomegaly with normal pulmonary vascularity. No acute osseous abnormality.  IMPRESSION: Persistent unchanged right lower lobe infiltrate consistent with pneumonia.   Electronically Signed   By: Marcello Moores  Register   On: 05/19/2013 12:18    Scheduled Meds: . aspirin EC  81 mg Oral Daily  . diltiazem  240 mg Oral Daily  . enoxaparin (LOVENOX) injection  40 mg Subcutaneous Daily  . fluticasone  2 spray Each Nare Daily  . levofloxacin  500 mg Oral Daily  . metoprolol succinate  75 mg Oral Daily  . predniSONE  7.5 mg Oral Q breakfast  . simvastatin  10 mg Oral q1800   Continuous Infusions: . sodium chloride 100 mL/hr at 05/20/13 1019    Principal Problem:   HCAP (healthcare-associated pneumonia) Active Problems:   CAP (community acquired pneumonia)   Colon cancer   Rheumatoid arthritis   HTN (hypertension)   HLD (hyperlipidemia)  Time spent: 12min  [facility], [facility] Pager [phone]. If 7PM-7AM, please contact night-coverage at [email or web address], password [facility] 05/21/2013, 11:14 AM  LOS: 5 days

## 2013-05-21 NOTE — Progress Notes (Signed)
Per RN patient is not D/C'ing today. Clinical Social Worker (CSW) made [facility] aware of above and the weekend supervisor reported that they can accept patient on Sunday if she is ready. CSW will continue to follow.   Blima Rich, [place] Weekend CSW [phone]

## 2013-05-22 ENCOUNTER — Inpatient Hospital Stay (HOSPITAL_COMMUNITY): Payer: Medicare Other

## 2013-05-22 DIAGNOSIS — C189 Malignant neoplasm of colon, unspecified: Secondary | ICD-10-CM

## 2013-05-22 DIAGNOSIS — J189 Pneumonia, unspecified organism: Secondary | ICD-10-CM

## 2013-05-22 DIAGNOSIS — R4182 Altered mental status, unspecified: Secondary | ICD-10-CM

## 2013-05-22 LAB — STOOL CULTURE

## 2013-05-22 LAB — CBC WITH DIFFERENTIAL/PLATELET
Basophils Absolute: 0 10*3/uL (ref 0.0–0.1)
Basophils Relative: 0 % (ref 0–1)
EOS ABS: 0.4 10*3/uL (ref 0.0–0.7)
Eosinophils Relative: 4 % (ref 0–5)
HCT: 36.2 % (ref 36.0–46.0)
HEMOGLOBIN: 12 g/dL (ref 12.0–15.0)
LYMPHS ABS: 1.4 10*3/uL (ref 0.7–4.0)
LYMPHS PCT: 13 % (ref 12–46)
MCH: 28 pg (ref 26.0–34.0)
MCHC: 33.1 g/dL (ref 30.0–36.0)
MCV: 84.6 fL (ref 78.0–100.0)
MONOS PCT: 17 % — AB (ref 3–12)
Monocytes Absolute: 1.8 10*3/uL — ABNORMAL HIGH (ref 0.1–1.0)
NEUTROS ABS: 6.8 10*3/uL (ref 1.7–7.7)
NEUTROS PCT: 66 % (ref 43–77)
PLATELETS: 377 10*3/uL (ref 150–400)
RBC: 4.28 MIL/uL (ref 3.87–5.11)
RDW: 16.6 % — ABNORMAL HIGH (ref 11.5–15.5)
WBC: 10.4 10*3/uL (ref 4.0–10.5)

## 2013-05-22 LAB — BASIC METABOLIC PANEL
BUN: 9 mg/dL (ref 6–23)
CALCIUM: 8.6 mg/dL (ref 8.4–10.5)
CO2: 24 mEq/L (ref 19–32)
Chloride: 98 mEq/L (ref 96–112)
Creatinine, Ser: 0.56 mg/dL (ref 0.50–1.10)
GFR, EST NON AFRICAN AMERICAN: 85 mL/min — AB (ref >90)
Glucose, Bld: 97 mg/dL (ref 70–99)
Potassium: 3.7 mEq/L (ref 3.7–5.3)
Sodium: 136 mEq/L — ABNORMAL LOW (ref 137–147)

## 2013-05-22 MED ORDER — HYDROCHLOROTHIAZIDE 25 MG PO TABS
25.0000 mg | ORAL_TABLET | Freq: Every day | ORAL | Status: DC
  Administered 2013-05-22: 25 mg via ORAL
  Filled 2013-05-22: qty 1

## 2013-05-22 MED ORDER — HYDROCOD POLST-CHLORPHEN POLST 10-8 MG/5ML PO LQCR: 5.0000 mL | Freq: Two times a day (BID) | ORAL | Status: DC | PRN

## 2013-05-22 MED ORDER — FLUTICASONE PROPIONATE 50 MCG/ACT NA SUSP: 2.0000 | Freq: Every day | NASAL | Status: DC

## 2013-05-22 MED ORDER — BENZONATATE 100 MG PO CAPS: 100.0000 mg | ORAL_CAPSULE | Freq: Three times a day (TID) | ORAL | Status: DC | PRN

## 2013-05-22 MED ORDER — LEVOFLOXACIN 500 MG PO TABS: 500.0000 mg | ORAL_TABLET | Freq: Every day | ORAL | Status: DC

## 2013-05-22 NOTE — Plan of Care (Signed)
Report called to Leana Roe, Therapist, sports over at [employer].

## 2013-05-22 NOTE — Discharge Summary (Addendum)
Physician Discharge Summary  Jamai Fest Vergara [id number] DOB: 08/25/1929 DOA: 05/16/2013  PCP: Pcp Not In System  Admit date: 05/16/2013 Discharge date: 05/22/2013  Time spent: 40 minutes  Recommendations for Outpatient Follow-up:  1. Follow up with PCP in 1-2 weeks 2. Would follow up with ENT after completing antibiotics 3. Would follow up with Pulmonology as scheduled on 4/13 4. Would follow up with Rheumatology as scheduled - holding methotrexate for now given patient's acute illness  Discharge Diagnoses:  Principal Problem:   HCAP (healthcare-associated pneumonia) Active Problems:   CAP (community acquired pneumonia)   Colon cancer   Rheumatoid arthritis   HTN (hypertension)   HLD (hyperlipidemia)   Discharge Condition: Improved  Diet recommendation: Regular  Filed Weights   05/17/13 1100  Weight: 67.6 kg (149 lb 0.5 oz)    History of present illness:  Rebecca Hodges is a 80 y.o. female has a past medical history significant for RA on MTX and PDN, history of colon cancer s/p resection in [place] in 10/2012 (no chemo or XRT per patient), currently staying in an independent living facility presented to ED for generalized fatigue, cough, nausea, vomiting and diarrhea. Patient seen in the ED overnight with mild confusion, much more alert this morning on my evaluation. She states that her diarrhea was very mild and lasted for a day, she has been feeling "lousy" for the past couple of days with worsening of her chronic cough. She just had a CT chest to evaluate her cough couple of weeks ago and her daughter will bring in the CT today. Now she denies nausea/vomiting or diarrhea, still feels very weak and coughing. Denies fever or chills. Denies chest pain, lightheadedness or dizziness. She has pain in her lateral right side of her chest with coughing. She is AxOx4.  In the ED, she was septic and a CXR showed pneumonia and was direct admitted to the floor.   Hospital  Course:  HCAP/Sinusitis - Initially started on vanc and cefepime with conversion to oral Levaquin now. Leukocytosis had normalized by the day of discharge (peak wbc of 32). Since starting abx, the patient remained afebrile. Added nasal steroid and cough suppressant. Some improvement with flutter valve. Pt reports feeling "worn out." Serial chest xrays demonstrated a gradual improvement in R sided airspace disease. The patient continues to be symptomatic despite objective signs of recovery. She will be discharged to SNF with cough suppressants and abx to complete 14 days of treatment. An outpatient ENT referral for sinusitis should be considered after completion of antibiotics. Sepsis - on admission, secondary to above-resolved Leukocytosis - secondary to PNA and with steroid use-improved  Hypokalemia  RA - continued Prednisone, holding Methotrexate secondary to acute illness HTN - Would resume home meds on discharge HLD - continue Zocor  Chronic cough - tessalon perles and tussionex per above.  Colon cancer, s/p resection 6 months ago. Outpatient follow up with her oncologist in [place] (Dr. Cruzita Lederer)  Diarrhea - Cdiff neg. Stool cx neg  Procedures:  Serial chest xrays  Discharge Exam: Filed Vitals:   05/21/13 2115 05/22/13 0550 05/22/13 0615 05/22/13 1019  BP: 167/76 189/76 160/78 163/64  Pulse: 60 61  66  Temp: 97.5 F (36.4 C) 97.9 F (36.6 C)  97.6 F (36.4 C)  TempSrc: Oral Oral  Oral  Resp: 20 18  18   Height:      Weight:      SpO2: 96% 96%  94%    General: Awake, in nad  Cardiovascular: regular, s1, s2 Respiratory: normal resp effort, no wheezing  Discharge Instructions     Medication List    STOP taking these medications       methotrexate 2.5 MG tablet  Commonly known as:  RHEUMATREX      TAKE these medications       aspirin 81 MG tablet  Take 81 mg by mouth daily.     benzonatate 100 MG capsule  Commonly known as:  TESSALON  Take 1 capsule (100 mg  total) by mouth 3 (three) times daily as needed for cough.     chlorpheniramine-HYDROcodone 10-8 MG/5ML Lqcr  Commonly known as:  TUSSIONEX  Take 5 mLs by mouth every 12 (twelve) hours as needed for cough.     diltiazem 240 MG 24 hr capsule  Commonly known as:  CARDIZEM CD  Take 240 mg by mouth daily.     fluticasone 50 MCG/ACT nasal spray  Commonly known as:  FLONASE  Place 2 sprays into both nostrils daily.     folic acid 1 MG tablet  Commonly known as:  FOLVITE  Take 1 mg by mouth daily.     hydrochlorothiazide 25 MG tablet  Commonly known as:  HYDRODIURIL  Take 25 mg by mouth daily.     levofloxacin 500 MG tablet  Commonly known as:  LEVAQUIN  Take 1 tablet (500 mg total) by mouth daily.     metoprolol succinate 25 MG 24 hr tablet  Commonly known as:  TOPROL-XL  Take 75 mg by mouth daily.     predniSONE 5 MG tablet  Commonly known as:  DELTASONE  Take 7.5 mg by mouth daily with breakfast.     simvastatin 10 MG tablet  Commonly known as:  ZOCOR  Take 10 mg by mouth daily.       Allergies  Allergen Reactions  . Penicillins     Unknown (tolerated cefepime 05/18/13 - Thuy)      The results of significant diagnostics from this hospitalization (including imaging, microbiology, ancillary and laboratory) are listed below for reference.    Significant Diagnostic Studies: Dg Chest Port 1 View  05/19/2013   CLINICAL DATA:  Shortness of breath.  EXAM: PORTABLE CHEST - 1 VIEW  COMPARISON:  DG CHEST 1V PORT dated 05/16/2013  FINDINGS: Mediastinum and hilar structures normal. Persistent infiltrate right lower lobe is present. No significant change noted. No pleural effusion or pneumothorax. Stable cardiomegaly with normal pulmonary vascularity. No acute osseous abnormality.  IMPRESSION: Persistent unchanged right lower lobe infiltrate consistent with pneumonia.   Electronically Signed   By: Marcello Moores  Register   On: 05/19/2013 12:18   Dg Chest Port 1 View  05/16/2013   CLINICAL  DATA:  Altered mental status, nausea vomiting and diarrhea 4 weeks  EXAM: PORTABLE CHEST - 1 VIEW  COMPARISON:  Recent prior chest x-ray 04/21/2013; CT chest 04/28/2013  FINDINGS: Interval development of dense right basilar infiltrate. Cardiac and mediastinal contours are unchanged. There is mild cardiomegaly. Slightly increased pulmonary vascular congestion. Persistent background changes of emphysema and COPD. No acute osseous abnormality.  IMPRESSION: Interval development of dense right basilar infiltrate concerning for pneumonia or potentially aspiration.   Electronically Signed   By: Jacqulynn Cadet M.D.   On: 05/16/2013 22:50    Microbiology: Recent Results (from the past 240 hour(s))  CULTURE, BLOOD (ROUTINE X 2)     Status: None   Collection Time    05/16/13 10:20 PM      Result Value  Ref Range Status   Specimen Description BLOOD LEFT ARM   Final   Special Requests BOTTLES DRAWN AEROBIC AND ANAEROBIC 5ML EACH   Final   Culture  Setup Time     Final   Value: 05/17/2013 09:54     Performed at [facility]   Culture     Final   Value:        BLOOD CULTURE RECEIVED NO GROWTH TO DATE CULTURE WILL BE HELD FOR 5 DAYS BEFORE ISSUING A FINAL NEGATIVE REPORT     Performed at [facility]   Report Status PENDING   Incomplete  CULTURE, BLOOD (ROUTINE X 2)     Status: None   Collection Time    05/16/13 10:25 PM      Result Value Ref Range Status   Specimen Description BLOOD LEFT FOREARM   Final   Special Requests BOTTLES DRAWN AEROBIC AND ANAEROBIC 5ML EACH   Final   Culture  Setup Time     Final   Value: 05/17/2013 09:54     Performed at [facility]   Culture     Final   Value:        BLOOD CULTURE RECEIVED NO GROWTH TO DATE CULTURE WILL BE HELD FOR 5 DAYS BEFORE ISSUING A FINAL NEGATIVE REPORT     Performed at [facility]   Report Status PENDING   Incomplete  URINE CULTURE     Status: None   Collection Time    05/16/13 10:50 PM      Result Value Ref  Range Status   Specimen Description URINE, CATHETERIZED   Final   Special Requests NONE   Final   Culture  Setup Time     Final   Value: 05/17/2013 09:56     Performed at [facility]     Final   Value: NO GROWTH     Performed at [facility]   Culture     Final   Value: NO GROWTH     Performed at [facility]   Report Status 05/18/2013 FINAL   Final  CULTURE, EXPECTORATED SPUTUM-ASSESSMENT     Status: None   Collection Time    05/17/13  3:35 PM      Result Value Ref Range Status   Specimen Description SPUTUM   Final   Special Requests NONE   Final   Sputum evaluation     Final   Value: THIS SPECIMEN IS ACCEPTABLE. RESPIRATORY CULTURE REPORT TO FOLLOW.   Report Status 05/17/2013 FINAL   Final  CULTURE, RESPIRATORY (NON-EXPECTORATED)     Status: None   Collection Time    05/17/13  3:35 PM      Result Value Ref Range Status   Specimen Description SPUTUM   Final   Special Requests NONE   Final   Gram Stain     Final   Value: MODERATE WBC PRESENT,BOTH PMN AND MONONUCLEAR     NO SQUAMOUS EPITHELIAL CELLS SEEN     NO ORGANISMS SEEN     Performed at [facility]   Culture     Final   Value: NORMAL OROPHARYNGEAL FLORA     Performed at [facility]   Report Status 05/20/2013 FINAL   Final  CLOSTRIDIUM DIFFICILE BY PCR     Status: None   Collection Time    05/18/13  2:52 PM      Result Value Ref Range Status  C difficile by pcr NEGATIVE  NEGATIVE Final  STOOL CULTURE     Status: None   Collection Time    05/18/13  2:52 PM      Result Value Ref Range Status   Specimen Description STOOL   Final   Special Requests NONE   Final   Culture     Final   Value: NO SUSPICIOUS COLONIES, CONTINUING TO HOLD     Note: REDUCED NORMAL FLORA PRESENT     Performed at [facility]   Report Status PENDING   Incomplete     Labs: Basic Metabolic Panel:  Recent Labs Lab 05/16/13 2230 05/17/13 1141 05/18/13 0528  05/20/13 0500 05/20/13 0535 05/22/13 0355  NA 135* 136* 139  --  134* 136*  K 3.0* 2.6* 3.9  --  2.9* 3.7  CL 92* 97 102  --  96 98  CO2 26 23 23   --  24 24  GLUCOSE 116* 95 81  --  86 97  BUN 14 11 10   --  5* 9  CREATININE 0.60 0.56 0.52  --  0.46* 0.56  CALCIUM 9.3 7.6* 8.3*  --  8.3* 8.6  MG  --   --   --  1.3*  --   --    Liver Function Tests:  Recent Labs Lab 05/16/13 2230  AST 25  ALT 17  ALKPHOS 86  BILITOT 0.4  PROT 6.5  ALBUMIN 3.6   No results found for this basename: LIPASE, AMYLASE,  in the last 168 hours No results found for this basename: AMMONIA,  in the last 168 hours CBC:  Recent Labs Lab 05/16/13 2230 05/17/13 1141 05/18/13 0528 05/19/13 1025 05/20/13 0535 05/21/13 0538 05/22/13 0355  WBC 23.1* 32.0* 26.1* 20.2* 14.5* 15.4* 10.4  NEUTROABS 21.5* 29.1*  --   --  11.4* 11.5* 6.8  HGB 13.1 11.5* 11.9* 12.0 12.0 12.2 12.0  HCT 39.0 34.4* 35.6* 36.4 36.0 36.8 36.2  MCV 85.5 84.9 85.0 86.3 84.9 85.8 84.6  PLT 371 325 318 324 330 356 377   Cardiac Enzymes: No results found for this basename: CKTOTAL, CKMB, CKMBINDEX, TROPONINI,  in the last 168 hours BNP: BNP (last 3 results) No results found for this basename: PROBNP,  in the last 8760 hours CBG:  Recent Labs Lab 05/16/13 2226 05/18/13 0131  GLUCAP 115* 78   Signed:  Idriss Quackenbush K  [facility] 05/22/2013, 10:38 AM

## 2013-05-22 NOTE — Progress Notes (Signed)
Patient is medically stable to D/C to [facility]. Per Leana Roe RN at [place] patient is going to room 7002. Clinical Education officer, museum (CSW) faxed [facility] D/C summary, prepared D/C packet, and arranged non-emergency EMS ([facility]) for transport. Patient reported that she has called her family and made them aware she is moving to [place] [employer] today. RN will call patient's son Clair Gulling at [phone] when [facility] arrives at the hospital to transport patient. Please reconsult if further social work needs arise. CSW signing off.   Blima Rich, [place] [facility] CSW [phone]

## 2013-05-23 LAB — CULTURE, BLOOD (ROUTINE X 2)
CULTURE: NO GROWTH
Culture: NO GROWTH

## 2016-04-17 DIAGNOSIS — E871 Hypo-osmolality and hyponatremia: Secondary | ICD-10-CM

## 2016-04-17 DIAGNOSIS — E876 Hypokalemia: Secondary | ICD-10-CM

## 2016-04-17 HISTORY — DX: Hypo-osmolality and hyponatremia: E87.1

## 2016-04-17 HISTORY — DX: Hypokalemia: E87.6

## 2016-05-13 ENCOUNTER — Emergency Department (HOSPITAL_BASED_OUTPATIENT_CLINIC_OR_DEPARTMENT_OTHER): Payer: Medicare Other

## 2016-05-13 ENCOUNTER — Encounter (HOSPITAL_BASED_OUTPATIENT_CLINIC_OR_DEPARTMENT_OTHER): Payer: Self-pay | Admitting: Emergency Medicine

## 2016-05-13 ENCOUNTER — Inpatient Hospital Stay (HOSPITAL_BASED_OUTPATIENT_CLINIC_OR_DEPARTMENT_OTHER)
Admission: EM | Admit: 2016-05-13 | Discharge: 2016-05-15 | DRG: 641 | Disposition: A | Discharge status: Home Health | Payer: Medicare Other | Attending: Nephrology | Admitting: Nephrology

## 2016-05-13 DIAGNOSIS — R1030 Lower abdominal pain, unspecified: Secondary | ICD-10-CM | POA: Diagnosis present

## 2016-05-13 DIAGNOSIS — M069 Rheumatoid arthritis, unspecified: Secondary | ICD-10-CM | POA: Diagnosis present

## 2016-05-13 DIAGNOSIS — T502X5A Adverse effect of carbonic-anhydrase inhibitors, benzothiadiazides and other diuretics, initial encounter: Secondary | ICD-10-CM | POA: Diagnosis present

## 2016-05-13 DIAGNOSIS — M810 Age-related osteoporosis without current pathological fracture: Secondary | ICD-10-CM | POA: Diagnosis present

## 2016-05-13 DIAGNOSIS — M545 Low back pain: Secondary | ICD-10-CM | POA: Diagnosis present

## 2016-05-13 DIAGNOSIS — Z8261 Family history of arthritis: Secondary | ICD-10-CM

## 2016-05-13 DIAGNOSIS — Z7982 Long term (current) use of aspirin: Secondary | ICD-10-CM

## 2016-05-13 DIAGNOSIS — E871 Hypo-osmolality and hyponatremia: Secondary | ICD-10-CM | POA: Diagnosis not present

## 2016-05-13 DIAGNOSIS — I10 Essential (primary) hypertension: Secondary | ICD-10-CM | POA: Diagnosis present

## 2016-05-13 DIAGNOSIS — Z9049 Acquired absence of other specified parts of digestive tract: Secondary | ICD-10-CM

## 2016-05-13 DIAGNOSIS — C182 Malignant neoplasm of ascending colon: Secondary | ICD-10-CM

## 2016-05-13 DIAGNOSIS — E876 Hypokalemia: Secondary | ICD-10-CM | POA: Diagnosis not present

## 2016-05-13 DIAGNOSIS — T380X5A Adverse effect of glucocorticoids and synthetic analogues, initial encounter: Secondary | ICD-10-CM | POA: Diagnosis present

## 2016-05-13 DIAGNOSIS — Z79899 Other long term (current) drug therapy: Secondary | ICD-10-CM

## 2016-05-13 DIAGNOSIS — Z7952 Long term (current) use of systemic steroids: Secondary | ICD-10-CM

## 2016-05-13 DIAGNOSIS — Z880 Allergy status to penicillin: Secondary | ICD-10-CM

## 2016-05-13 DIAGNOSIS — R109 Unspecified abdominal pain: Secondary | ICD-10-CM | POA: Diagnosis present

## 2016-05-13 DIAGNOSIS — C189 Malignant neoplasm of colon, unspecified: Secondary | ICD-10-CM | POA: Diagnosis present

## 2016-05-13 DIAGNOSIS — K5641 Fecal impaction: Secondary | ICD-10-CM | POA: Diagnosis present

## 2016-05-13 DIAGNOSIS — C8590 Non-Hodgkin lymphoma, unspecified, unspecified site: Secondary | ICD-10-CM | POA: Diagnosis present

## 2016-05-13 DIAGNOSIS — Z7983 Long term (current) use of bisphosphonates: Secondary | ICD-10-CM

## 2016-05-13 DIAGNOSIS — Z85038 Personal history of other malignant neoplasm of large intestine: Secondary | ICD-10-CM

## 2016-05-13 DIAGNOSIS — E785 Hyperlipidemia, unspecified: Secondary | ICD-10-CM | POA: Diagnosis present

## 2016-05-13 DIAGNOSIS — D72829 Elevated white blood cell count, unspecified: Secondary | ICD-10-CM | POA: Diagnosis present

## 2016-05-13 HISTORY — DX: Malignant neoplasm of colon, unspecified: C18.9

## 2016-05-13 HISTORY — DX: Age-related osteoporosis without current pathological fracture: M81.0

## 2016-05-13 HISTORY — DX: Hypokalemia: E87.6

## 2016-05-13 HISTORY — DX: Waldenstrom macroglobulinemia: C88.0

## 2016-05-13 HISTORY — DX: Hypo-osmolality and hyponatremia: E87.1

## 2016-05-13 HISTORY — DX: Non-Hodgkin lymphoma, unspecified, unspecified site: C85.90

## 2016-05-13 LAB — CBC WITH DIFFERENTIAL/PLATELET
Basophils Absolute: 0 10*3/uL (ref 0.0–0.1)
Basophils Relative: 0 %
Eosinophils Absolute: 0 10*3/uL (ref 0.0–0.7)
Eosinophils Relative: 0 %
HCT: 39.1 % (ref 36.0–46.0)
Hemoglobin: 13.9 g/dL (ref 12.0–15.0)
LYMPHS PCT: 4 %
Lymphs Abs: 0.9 10*3/uL (ref 0.7–4.0)
MCH: 31.2 pg (ref 26.0–34.0)
MCHC: 35.5 g/dL (ref 30.0–36.0)
MCV: 87.9 fL (ref 78.0–100.0)
MONO ABS: 1.9 10*3/uL — AB (ref 0.1–1.0)
MONOS PCT: 8 %
Neutro Abs: 19.7 10*3/uL — ABNORMAL HIGH (ref 1.7–7.7)
Neutrophils Relative %: 88 %
Platelets: 287 10*3/uL (ref 150–400)
RBC: 4.45 MIL/uL (ref 3.87–5.11)
RDW: 13.7 % (ref 11.5–15.5)
WBC: 22.5 10*3/uL — ABNORMAL HIGH (ref 4.0–10.5)

## 2016-05-13 LAB — COMPREHENSIVE METABOLIC PANEL
ALT: 17 U/L (ref 14–54)
AST: 31 U/L (ref 15–41)
Albumin: 4 g/dL (ref 3.5–5.0)
Alkaline Phosphatase: 57 U/L (ref 38–126)
Anion gap: 12 (ref 5–15)
BUN: 10 mg/dL (ref 6–20)
CO2: 24 mmol/L (ref 22–32)
Calcium: 8.7 mg/dL — ABNORMAL LOW (ref 8.9–10.3)
Chloride: 85 mmol/L — ABNORMAL LOW (ref 101–111)
Creatinine, Ser: 0.53 mg/dL (ref 0.44–1.00)
GFR calc Af Amer: >60 mL/min (ref >60)
GFR calc non Af Amer: >60 mL/min (ref >60)
Glucose, Bld: 137 mg/dL — ABNORMAL HIGH (ref 65–99)
Potassium: 2.5 mmol/L — CL (ref 3.5–5.1)
Sodium: 121 mmol/L — ABNORMAL LOW (ref 135–145)
Total Bilirubin: 1.7 mg/dL — ABNORMAL HIGH (ref 0.3–1.2)
Total Protein: 6.5 g/dL (ref 6.5–8.1)

## 2016-05-13 LAB — BASIC METABOLIC PANEL
ANION GAP: 9 (ref 5–15)
Anion gap: 11 (ref 5–15)
BUN: 5 mg/dL — ABNORMAL LOW (ref 6–20)
BUN: 7 mg/dL (ref 6–20)
CALCIUM: 7.8 mg/dL — AB (ref 8.9–10.3)
CALCIUM: 7.6 mg/dL — AB (ref 8.9–10.3)
CO2: 24 mmol/L (ref 22–32)
CO2: 25 mmol/L (ref 22–32)
Chloride: 88 mmol/L — ABNORMAL LOW (ref 101–111)
Chloride: 87 mmol/L — ABNORMAL LOW (ref 101–111)
Creatinine, Ser: 0.49 mg/dL (ref 0.44–1.00)
Creatinine, Ser: 0.6 mg/dL (ref 0.44–1.00)
GLUCOSE: 133 mg/dL — AB (ref 65–99)
Glucose, Bld: 126 mg/dL — ABNORMAL HIGH (ref 65–99)
POTASSIUM: 2.7 mmol/L — AB (ref 3.5–5.1)
POTASSIUM: 2.6 mmol/L — AB (ref 3.5–5.1)
SODIUM: 121 mmol/L — AB (ref 135–145)
Sodium: 123 mmol/L — ABNORMAL LOW (ref 135–145)

## 2016-05-13 LAB — URINALYSIS, ROUTINE W REFLEX MICROSCOPIC
Bilirubin Urine: NEGATIVE
GLUCOSE, UA: NEGATIVE mg/dL
Hgb urine dipstick: NEGATIVE
Ketones, ur: NEGATIVE mg/dL
LEUKOCYTES UA: NEGATIVE
Nitrite: NEGATIVE
PH: 7 (ref 5.0–8.0)
Protein, ur: NEGATIVE mg/dL
Specific Gravity, Urine: 1.011 (ref 1.005–1.030)

## 2016-05-13 LAB — MAGNESIUM: Magnesium: 1.4 mg/dL — ABNORMAL LOW (ref 1.7–2.4)

## 2016-05-13 LAB — LIPASE, BLOOD: Lipase: 15 U/L (ref 11–51)

## 2016-05-13 LAB — SEDIMENTATION RATE: Sed Rate: 9 mm/hr (ref 0–22)

## 2016-05-13 LAB — LACTIC ACID, PLASMA
LACTIC ACID, VENOUS: 0.9 mmol/L (ref 0.5–1.9)
Lactic Acid, Venous: 0.7 mmol/L (ref 0.5–1.9)

## 2016-05-13 LAB — C-REACTIVE PROTEIN: CRP: 11.8 mg/dL — ABNORMAL HIGH (ref <1.0)

## 2016-05-13 MED ORDER — ONDANSETRON HCL 4 MG/2ML IJ SOLN
4.0000 mg | Freq: Once | INTRAMUSCULAR | Status: AC
  Administered 2016-05-13: 4 mg via INTRAVENOUS
  Filled 2016-05-13: qty 2

## 2016-05-13 MED ORDER — DILTIAZEM HCL ER COATED BEADS 240 MG PO CP24
240.0000 mg | ORAL_CAPSULE | Freq: Every day | ORAL | Status: DC
  Administered 2016-05-13 – 2016-05-15 (×3): 240 mg via ORAL
  Filled 2016-05-13 (×3): qty 1

## 2016-05-13 MED ORDER — POTASSIUM CHLORIDE 10 MEQ/100ML IV SOLN
10.0000 meq | INTRAVENOUS | Status: AC
  Administered 2016-05-13 (×4): 10 meq via INTRAVENOUS
  Filled 2016-05-13 (×4): qty 100

## 2016-05-13 MED ORDER — HYDRALAZINE HCL 20 MG/ML IJ SOLN: 5.0000 mg | INTRAMUSCULAR | Status: DC | PRN

## 2016-05-13 MED ORDER — SODIUM CHLORIDE 0.9 % IV SOLN
INTRAVENOUS | Status: DC
  Administered 2016-05-13: 14:00:00 via INTRAVENOUS
  Administered 2016-05-14: 1000 mL via INTRAVENOUS

## 2016-05-13 MED ORDER — METHOTREXATE 2.5 MG PO TABS
15.0000 mg | ORAL_TABLET | ORAL | Status: DC
  Administered 2016-05-14: 15 mg via ORAL
  Filled 2016-05-13: qty 6

## 2016-05-13 MED ORDER — METOCLOPRAMIDE HCL 5 MG/ML IJ SOLN
10.0000 mg | Freq: Two times a day (BID) | INTRAMUSCULAR | Status: DC
  Administered 2016-05-13 – 2016-05-15 (×5): 10 mg via INTRAVENOUS
  Filled 2016-05-13 (×5): qty 2

## 2016-05-13 MED ORDER — ACETAMINOPHEN 650 MG RE SUPP: 650.0000 mg | Freq: Four times a day (QID) | RECTAL | Status: DC | PRN

## 2016-05-13 MED ORDER — MAGNESIUM SULFATE 2 GM/50ML IV SOLN
2.0000 g | Freq: Once | INTRAVENOUS | Status: AC
  Administered 2016-05-13: 2 g via INTRAVENOUS
  Filled 2016-05-13: qty 50

## 2016-05-13 MED ORDER — SORBITOL 70 % SOLN
960.0000 mL | TOPICAL_OIL | Freq: Once | ORAL | Status: AC
  Administered 2016-05-13: 960 mL via RECTAL
  Filled 2016-05-13: qty 240

## 2016-05-13 MED ORDER — SODIUM CHLORIDE 0.9 % IV SOLN
30.0000 meq | Freq: Once | INTRAVENOUS | Status: AC
  Administered 2016-05-14: 30 meq via INTRAVENOUS
  Filled 2016-05-13: qty 15

## 2016-05-13 MED ORDER — CLOBETASOL PROPIONATE 0.05 % EX CREA
TOPICAL_CREAM | Freq: Two times a day (BID) | CUTANEOUS | Status: DC | PRN
  Filled 2016-05-13: qty 15

## 2016-05-13 MED ORDER — ACETAMINOPHEN 325 MG PO TABS: 650.0000 mg | ORAL_TABLET | Freq: Four times a day (QID) | ORAL | Status: DC | PRN

## 2016-05-13 MED ORDER — PREDNISONE 5 MG PO TABS
7.5000 mg | ORAL_TABLET | Freq: Every day | ORAL | Status: DC
  Administered 2016-05-14 – 2016-05-15 (×2): 7.5 mg via ORAL
  Filled 2016-05-13 (×2): qty 2

## 2016-05-13 MED ORDER — TRAMADOL HCL 50 MG PO TABS: 50.0000 mg | ORAL_TABLET | Freq: Four times a day (QID) | ORAL | Status: DC | PRN

## 2016-05-13 MED ORDER — METOPROLOL SUCCINATE ER 50 MG PO TB24
75.0000 mg | ORAL_TABLET | Freq: Every day | ORAL | Status: DC
  Administered 2016-05-13 – 2016-05-15 (×3): 75 mg via ORAL
  Filled 2016-05-13 (×3): qty 1

## 2016-05-13 MED ORDER — CLOBETASOL PROPIONATE 0.05 % EX CREA: 1.0000 "application " | TOPICAL_CREAM | Freq: Two times a day (BID) | CUTANEOUS | Status: DC | PRN

## 2016-05-13 MED ORDER — IOPAMIDOL (ISOVUE-300) INJECTION 61%
100.0000 mL | Freq: Once | INTRAVENOUS | Status: AC | PRN
  Administered 2016-05-13: 100 mL via INTRAVENOUS

## 2016-05-13 MED ORDER — PROMETHAZINE HCL 25 MG/ML IJ SOLN: 6.2500 mg | Freq: Four times a day (QID) | INTRAMUSCULAR | Status: DC | PRN

## 2016-05-13 MED ORDER — FENTANYL CITRATE (PF) 100 MCG/2ML IJ SOLN
25.0000 ug | INTRAMUSCULAR | Status: DC | PRN
  Administered 2016-05-13 (×2): 50 ug via INTRAVENOUS
  Filled 2016-05-13 (×2): qty 2

## 2016-05-13 MED ORDER — FENTANYL CITRATE (PF) 100 MCG/2ML IJ SOLN
50.0000 ug | Freq: Once | INTRAMUSCULAR | Status: AC
  Administered 2016-05-13: 50 ug via INTRAVENOUS
  Filled 2016-05-13: qty 2

## 2016-05-13 MED ORDER — METHOTREXATE 2.5 MG PO TABS: 15.0000 mg | ORAL_TABLET | ORAL | Status: DC

## 2016-05-13 MED ORDER — HEPARIN SODIUM (PORCINE) 5000 UNIT/ML IJ SOLN
5000.0000 [IU] | Freq: Three times a day (TID) | INTRAMUSCULAR | Status: DC
  Administered 2016-05-13 – 2016-05-15 (×7): 5000 [IU] via SUBCUTANEOUS
  Filled 2016-05-13 (×7): qty 1

## 2016-05-13 MED ORDER — SIMVASTATIN 10 MG PO TABS
10.0000 mg | ORAL_TABLET | Freq: Every day | ORAL | Status: DC
  Administered 2016-05-13 – 2016-05-15 (×3): 10 mg via ORAL
  Filled 2016-05-13 (×3): qty 1

## 2016-05-13 MED ORDER — POTASSIUM CHLORIDE CRYS ER 20 MEQ PO TBCR
20.0000 meq | EXTENDED_RELEASE_TABLET | Freq: Two times a day (BID) | ORAL | Status: DC
  Administered 2016-05-13 – 2016-05-15 (×3): 20 meq via ORAL
  Filled 2016-05-13 (×5): qty 1

## 2016-05-13 MED ORDER — FENTANYL CITRATE (PF) 100 MCG/2ML IJ SOLN
50.0000 ug | INTRAMUSCULAR | Status: DC | PRN
  Administered 2016-05-13 (×2): 50 ug via INTRAVENOUS
  Filled 2016-05-13 (×3): qty 2

## 2016-05-13 MED ORDER — ASPIRIN EC 81 MG PO TBEC
81.0000 mg | DELAYED_RELEASE_TABLET | Freq: Every day | ORAL | Status: DC
  Administered 2016-05-14 – 2016-05-15 (×2): 81 mg via ORAL
  Filled 2016-05-13 (×2): qty 1

## 2016-05-13 MED ORDER — SODIUM CHLORIDE 0.9 % IV SOLN
Freq: Once | INTRAVENOUS | Status: AC
  Administered 2016-05-13: 03:00:00 via INTRAVENOUS

## 2016-05-13 MED ORDER — POTASSIUM CHLORIDE CRYS ER 20 MEQ PO TBCR
40.0000 meq | EXTENDED_RELEASE_TABLET | Freq: Once | ORAL | Status: AC
  Administered 2016-05-13: 40 meq via ORAL
  Filled 2016-05-13: qty 2

## 2016-05-13 NOTE — Progress Notes (Signed)
Patient with abdominal pain. CT abdomen complete. SMOG enema given. Patient tolerated fair with some cramping. Moderate amount soft formed stool returned aftger two-thirds enema administered. Will continue to monitor. Manya Silvas B

## 2016-05-13 NOTE — Progress Notes (Signed)
CRITICAL VALUE ALERT  Critical value received: K  Date of notification:  05/13/16  Time of notification:  20:30   Critical value read back:Yes.    Nurse who received alert:  Delcie Roch, RN  MD notified (1st page):  [facility] Time of first page:  20:50  MD notified (2nd page):  Time of second page:  Responding MD: Jennet Maduro, NP  Time MD responded:2130

## 2016-05-13 NOTE — Progress Notes (Addendum)
Transfer from [facility]  Ms. Coffie is a 83 year old female with pmh RA on chronic steroids and colon cancer s/p resection; who presents with complaints of acute onset of abdominal pain.VS stable. Labs reveal WBC 22.5, sodium 121, potassium 2.5, and chloride 85. Initial CT scan showed s extensive atherosclerotic calcifications, but no other acute findings for patient's symptoms. Patient was noted to have fecal impaction for which some was relieved with soapsuds enema. ED physician was asked to check a magnesium level prior to transport. Patient was ordered 40 mEq of IV potassium, IVF of NS at 125 ml/hr, Zofran, and IV fentanyl for pain. Admitted for observation to a telemetry bed as sodium and potassium findings are new. Questioned if elevated WBC was 2/2 chronic steroids.

## 2016-05-13 NOTE — ED Triage Notes (Signed)
Pt c/o lower abd pain and lower back pain. Pt reports several soft BM yesterday, denies n/v/d.

## 2016-05-13 NOTE — Progress Notes (Signed)
Notified pharmacy for Potassium to be hung.

## 2016-05-13 NOTE — ED Provider Notes (Signed)
[place] [facility] [facility] Provider Note: [name], MD, FACEP  CSN: [id number] MRN: [id number] ARRIVAL: 05/13/16 at [place]: [place]  Abdominal Pain   Rebecca Hodges is a 83 y.o. female with a history of rheumatoid arthritis and colon cancer. She is here with lower abdominal and lower back pain that began about 14 hours ago. The onset was gradual. The pain has steadily worsened and she now rates it as a 6 out of 10. It is dull in nature. The pain is worse with lying flat on her back and improved lying on her side. Pain is also exacerbated with deep palpation of the lower abdomen. She denies fever, nausea or vomiting. She had multiple bowel movements yesterday which were softer than usual but not described as diarrhea.   Past Medical History:  Diagnosis Date  . Colon cancer ([place]) 10/2012  . Hypertension   . Lymphoma ([place])   . Osteoporosis   . Rheumatoid arthritis ([place])   . Waldenstrom macroglobulinemia ([place])     History reviewed. No pertinent surgical history.  No family history on file.  Social History  Substance Use Topics  . Smoking status: Never Smoker  . Smokeless tobacco: Never Used  . Alcohol use No    Prior to Admission medications   Medication Sig Start Date End Date Taking? Authorizing Provider  alendronate (FOSAMAX) 70 MG tablet Take 70 mg by mouth once a week. Take with a full glass of water on an empty stomach.   Yes Historical Provider, MD  aspirin 81 MG tablet Take 81 mg by mouth daily.   Yes Historical Provider, MD  calcium carbonate (OSCAL) 1500 (600 Ca) MG TABS tablet Take 1,500 mg by mouth 2 (two) times daily with a meal.   Yes Historical Provider, MD  clobetasol cream (TEMOVATE) 2.95 % Apply 1 application topically 2 (two) times daily.   Yes Historical Provider, MD  diltiazem (CARDIZEM CD) 240 MG 24 hr capsule Take 240 mg by mouth daily.   Yes Historical Provider, MD  folic acid (FOLVITE) 1 MG  tablet Take 1 mg by mouth daily.   Yes Historical Provider, MD  hydrochlorothiazide (HYDRODIURIL) 25 MG tablet Take 25 mg by mouth daily.   Yes Historical Provider, MD  methotrexate 2.5 MG tablet Take 15 mg by mouth once a week.   Yes Historical Provider, MD  metoprolol succinate (TOPROL-XL) 25 MG 24 hr tablet Take 75 mg by mouth daily.   Yes Historical Provider, MD  potassium chloride SA (K-DUR,KLOR-CON) 20 MEQ tablet Take 20 mEq by mouth 2 (two) times daily.   Yes Historical Provider, MD  predniSONE (DELTASONE) 5 MG tablet Take 7.5 mg by mouth daily with breakfast.    Yes Historical Provider, MD  simvastatin (ZOCOR) 10 MG tablet Take 10 mg by mouth daily.   Yes Historical Provider, MD    Allergies Penicillins   REVIEW OF SYSTEMS  Negative except as noted here or in the History of Present Illness.   PHYSICAL EXAMINATION  Initial Vital Signs Blood pressure (!) 147/68, pulse 72, temperature 98 F (36.7 C), temperature source Oral, resp. rate 20, SpO2 98 %.  Examination General: Well-developed, well-nourished female in no acute distress; appearance consistent with age of record HENT: normocephalic; atraumatic Eyes: pupils equal, round and reactive to light; extraocular muscles intact Neck: supple Heart: regular rate and rhythm Lungs: clear to auscultation bilaterally Abdomen: soft; nondistended; suprapubic tenderness on deep palpation; no masses or  hepatosplenomegaly; bowel sounds present Rectal: External hemorrhoids; soft fecal impaction Back: No lumbar tenderness Extremities: Ulnar deviation of fingers and other arthritic changes; pulses normal Neurologic: Awake, alert and oriented; motor function intact in all extremities and symmetric; no facial droop Skin: Warm and dry Psychiatric: Flat affect   RESULTS  Summary of this visit's results, reviewed by myself:   EKG Interpretation  Date/Time:  Tuesday May 13 2016 04:57:55 EDT Ventricular Rate:  74 PR Interval:    QRS  Duration: 190 QT Interval:  494 QTC Calculation: 549 R Axis:   172 Text Interpretation:  Sinus or ectopic atrial rhythm Right bundle branch block Prolonged PR interval Rate is slower Confirmed by Shandrea Lusk  MD, Jenny Reichmann ([id number]) on 05/13/2016 5:01:08 AM      Laboratory Studies: Results for orders placed or performed during the hospital encounter of 05/13/16 (from the past 24 hour(s))  Urinalysis, Routine w reflex microscopic     Status: None   Collection Time: 05/13/16  3:02 AM  Result Value Ref Range   Color, Urine YELLOW YELLOW   APPearance CLEAR CLEAR   Specific Gravity, Urine 1.011 1.005 - 1.030   pH 7.0 5.0 - 8.0   Glucose, UA NEGATIVE NEGATIVE mg/dL   Hgb urine dipstick NEGATIVE NEGATIVE   Bilirubin Urine NEGATIVE NEGATIVE   Ketones, ur NEGATIVE NEGATIVE mg/dL   Protein, ur NEGATIVE NEGATIVE mg/dL   Nitrite NEGATIVE NEGATIVE   Leukocytes, UA NEGATIVE NEGATIVE  CBC with Differential/Platelet     Status: Abnormal   Collection Time: 05/13/16  3:02 AM  Result Value Ref Range   WBC 22.5 (H) 4.0 - 10.5 K/uL   RBC 4.45 3.87 - 5.11 MIL/uL   Hemoglobin 13.9 12.0 - 15.0 g/dL   HCT 39.1 36.0 - 46.0 %   MCV 87.9 78.0 - 100.0 fL   MCH 31.2 26.0 - 34.0 pg   MCHC 35.5 30.0 - 36.0 g/dL   RDW 13.7 11.5 - 15.5 %   Platelets 287 150 - 400 K/uL   Neutrophils Relative % 88 %   Neutro Abs 19.7 (H) 1.7 - 7.7 K/uL   Lymphocytes Relative 4 %   Lymphs Abs 0.9 0.7 - 4.0 K/uL   Monocytes Relative 8 %   Monocytes Absolute 1.9 (H) 0.1 - 1.0 K/uL   Eosinophils Relative 0 %   Eosinophils Absolute 0.0 0.0 - 0.7 K/uL   Basophils Relative 0 %   Basophils Absolute 0.0 0.0 - 0.1 K/uL  Comprehensive metabolic panel     Status: Abnormal   Collection Time: 05/13/16  3:02 AM  Result Value Ref Range   Sodium 121 (L) 135 - 145 mmol/L   Potassium 2.5 (LL) 3.5 - 5.1 mmol/L   Chloride 85 (L) 101 - 111 mmol/L   CO2 24 22 - 32 mmol/L   Glucose, Bld 137 (H) 65 - 99 mg/dL   BUN 10 6 - 20 mg/dL   Creatinine, Ser  0.53 0.44 - 1.00 mg/dL   Calcium 8.7 (L) 8.9 - 10.3 mg/dL   Total Protein 6.5 6.5 - 8.1 g/dL   Albumin 4.0 3.5 - 5.0 g/dL   AST 31 15 - 41 U/L   ALT 17 14 - 54 U/L   Alkaline Phosphatase 57 38 - 126 U/L   Total Bilirubin 1.7 (H) 0.3 - 1.2 mg/dL   GFR calc non Af Amer >60 >60 mL/min   GFR calc Af Amer >60 >60 mL/min   Anion gap 12 5 - 15  Lipase, blood  Status: None   Collection Time: 05/13/16  3:02 AM  Result Value Ref Range   Lipase 15 11 - 51 U/L   Imaging Studies: Ct Abdomen Pelvis W Contrast  Result Date: 05/13/2016 CLINICAL DATA:  83 y/o F; general abdominal pain and lower back pain. History of colon cancer post resection and history of lymphoma. EXAM: CT ABDOMEN AND PELVIS WITH CONTRAST TECHNIQUE: Multidetector CT imaging of the abdomen and pelvis was performed using the standard protocol following bolus administration of intravenous contrast. CONTRAST:  12mL ISOVUE-300 IOPAMIDOL (ISOVUE-300) INJECTION 61% COMPARISON:  12/28/2015 CT of the abdomen and pelvis. FINDINGS: Lower chest: Centrilobular emphysema. Small stable right-sided Bochdalek hernia containing fat. Hepatobiliary: Multiple stable fluid attenuating foci within the liver the largest in segment 3 and segment 4B compatible with cysts. Normal gallbladder. No intra or extrahepatic biliary ductal dilatation. Pancreas: Unremarkable. No pancreatic ductal dilatation or surrounding inflammatory changes. Spleen: Normal in size without focal abnormality. Adrenals/Urinary Tract: Multiple lucencies in the kidneys bilaterally are stable from the prior study, well-circumscribed, and demonstrates fluid attenuation compatible with multiple cysts. The largest arises from the lower pole of left kidney and measures up to 4.9 cm. No urinary stone disease, hydronephrosis, or obstructive uropathy identified. Stomach/Bowel: Partial colectomy with anastomosis in the right lower quadrant. No inflammatory or obstructive changes of the bowel. Small  hiatal hernia. Moderate volume of stool in the rectum. Vascular/Lymphatic: Extensive calcific atherosclerosis of the aorta with shaggy fibrofatty plaque. Mixed plaque of the SMA origin with short segment of severe stenosis (series 5, image 39). 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 Bilateral common iliac artery dense calcified plaque with short segments of moderate to severe stenosis. No lymphadenopathy. Reproductive: Status post hysterectomy. No adnexal masses. Other: No abdominal wall hernia or abnormality. No abdominopelvic ascites. Musculoskeletal: Partially visualize right proximal femur intramedullary nail and proximal threaded screw. Grade 1 C3-4 anterolisthesis is stable. Multilevel degenerative changes of the lumbar spine with disc space narrowing severe at L4-5 and lower lumbar facet arthrosis. At least moderate canal stenosis at the L3-4 level. No acute osseous abnormality is identified. IMPRESSION: 1. No acute process identified as explanation for abdominal pain. 2. Extensive calcific atherosclerosis of the abdominal aorta with shaggy fibrofatty plaque and stable ectasia measuring up to 2.8 cm. 3. Mixed plaque of the superior mesenteric artery with short segment of severe proximal stenosis. 4. Multiple liver and renal cysts. 5. Small hiatal hernia. 6. Lumbar spondylosis with degenerative grade 1 anterolisthesis at C3-4 where there is at least moderate canal stenosis. Electronically Signed   By: Kristine Garbe M.D.   On: 05/13/2016 05:18    ED COURSE  Nursing notes and initial vitals signs, including pulse oximetry, reviewed.  Vitals:   05/13/16 0255  BP: (!) 147/68  Pulse: 72  Resp: 20  Temp: 98 F (36.7 C)  TempSrc: Oral  SpO2: 98%  Weight: 145 lb (65.8 kg)   5:29 AM We'll have patient admitted for acute hypokalemia and hyponatremia. These were not present on most recently available labs studies from November  of last year. We have begun potassium repletion IV. CT scan shows a rectal fecal impaction and we will administer a soapsuds enema. Her leukocytosis may be due to chronic steroid therapy.  5:53 AM Dr. Tamala Julian accepts for transfer to [facility]. Patient unable to tolerate soapsuds enema due to discomfort. A small amount of stool was passed.  PROCEDURES    ED DIAGNOSES  ICD-9-CM ICD-10-CM   1. Acute hypokalemia 276.8 E87.6   2. Acute hyponatremia 276.1 E87.1   3. Fecal impaction in rectum [facility]) 560.32 K56.41        Shanon Rosser, MD 05/13/16 [phone]

## 2016-05-13 NOTE — Progress Notes (Signed)
CRITICAL VALUE ALERT  Critical value received:  Serum osmolarity 247 Date of notification: 05/13/16  Time of notification: 3225  Critical value read back:Yes.    Nurse who received alert: Ramonita Lab MD notified (1st page):  [phone]  Time of first page: 45  MD notified (2nd page):  Time of second page:  Responding MD:  Time MD responded:

## 2016-05-13 NOTE — H&P (Signed)
History and Physical    [name] AYT:[id number] DOB: 01/06/1930 DOA: 05/13/2016  PCP: Pcp Not In System Patient coming from: Home[facility]  Chief Complaint: Abd pain  HPI: Rebecca Hodges is a 83 y.o. female with medical history significant of hypertension, lymphoma, rheumatoid arthritis, colon cancer, presenting w/ abdominal pain. Lower abdomen with radiation to the back. Started approximately one day ago. Gradual onset. Initially intermittent but now constant. Dull in nature. Worse with certain positions. Denies fevers, nausea, vomiting, diarrhea, constipation, hematochezia, melena, dysuria, frequency, back pain, flank pain, chest pain, shortness breath, palpitations. Patient does endorse increased frequency of stools over the last day though not described as diarrhea.   ED Course: Objective findings as outlined below. Zofran, fentanyl, IV fluids administered with little relief.  Review of Systems: As per HPI otherwise 10 point review of systems negative.   Ambulatory Status: No restrictions at baseline  Past Medical History:  Diagnosis Date  . Colon cancer ([place]) 10/2012   Stage II a PT 3 PN 0M0 invasive moderately differentiated adenocarcinoma of the ascending colon  . Hypertension   . Lymphoma ([place])   . Osteoporosis   . Rheumatoid arthritis ([place])   . Waldenstrom macroglobulinemia ([place])     Past Surgical History:  Procedure Laterality Date  . ABDOMINAL HYSTERECTOMY    . APPENDECTOMY    . PARTIAL COLECTOMY     2014 a sending partial colectomy    Social History   Social History  . Marital status: Single    Spouse name: N/A  . Number of children: N/A  . Years of education: N/A   Occupational History  . Not on file.   Social History Main Topics  . Smoking status: Never Smoker  . Smokeless tobacco: Never Used  . Alcohol use No  . Drug use: Unknown  . Sexual activity: Not on file   Other Topics Concern  . Not on file   Social History Narrative  .  No narrative on file    Allergies  Allergen Reactions  . Penicillins     Unknown (tolerated cefepime 05/18/13 - [facility])    Family History  Problem Relation Age of Onset  . Arthritis/Rheumatoid Mother     Prior to Admission medications   Medication Sig Start Date End Date Taking? Authorizing Provider  alendronate (FOSAMAX) 70 MG tablet Take 70 mg by mouth once a week. Take with a full glass of water on an empty stomach.   Yes Historical Provider, MD  aspirin 81 MG tablet Take 81 mg by mouth daily.   Yes Historical Provider, MD  calcium carbonate (OSCAL) 1500 (600 Ca) MG TABS tablet Take 1,500 mg by mouth 2 (two) times daily with a meal.   Yes Historical Provider, MD  clobetasol cream (TEMOVATE) 3.55 % Apply 1 application topically 2 (two) times daily.   Yes Historical Provider, MD  diltiazem (CARDIZEM CD) 240 MG 24 hr capsule Take 240 mg by mouth daily.   Yes Historical Provider, MD  folic acid (FOLVITE) 1 MG tablet Take 1 mg by mouth daily.   Yes Historical Provider, MD  hydrochlorothiazide (HYDRODIURIL) 25 MG tablet Take 25 mg by mouth daily.   Yes Historical Provider, MD  methotrexate 2.5 MG tablet Take 15 mg by mouth once a week.   Yes Historical Provider, MD  metoprolol succinate (TOPROL-XL) 25 MG 24 hr tablet Take 75 mg by mouth daily.   Yes Historical Provider, MD  potassium chloride SA (K-DUR,KLOR-CON) 20 MEQ tablet Take 20 mEq  by mouth 2 (two) times daily.   Yes Historical Provider, MD  predniSONE (DELTASONE) 5 MG tablet Take 7.5 mg by mouth daily with breakfast.    Yes Historical Provider, MD  simvastatin (ZOCOR) 10 MG tablet Take 10 mg by mouth daily.   Yes Historical Provider, MD    Physical Exam: Vitals:   05/13/16 0627 05/13/16 0736 05/13/16 0800 05/13/16 0939  BP: (!) 144/70 (!) 143/79 (!) 150/71 (!) 151/69  Pulse: 70  64 71  Resp: 20 20 20 20   Temp:    97.9 F (36.6 C)  TempSrc:    Oral  SpO2: 98% 91% 97% 98%  Weight:         General:  Appears calm and  comfortable Eyes:  PERRL, EOMI, normal lids, iris ENT:  grossly normal hearing, lips & tongue, mmm Neck:  no LAD, masses or thyromegaly Cardiovascular:  RRR, no m/r/g. No LE edema.  Respiratory:  CTA bilaterally, no w/r/r. Normal respiratory effort. Abdomen: Tender throughout the lower abdomen to light palpation, NABS, nondistended, soft, guarding Skin:  no rash or induration seen on limited exam Musculoskeletal:  grossly normal tone BUE/BLE, good ROM, no bony abnormality Psychiatric:  grossly normal mood and affect, speech fluent and appropriate, AOx3 Neurologic:  CN 2-12 grossly intact, moves all extremities in coordinated fashion, sensation intact  Labs on Admission: I have personally reviewed following labs and imaging studies  CBC:  Recent Labs Lab 05/13/16 0302  WBC 22.5*  NEUTROABS 19.7*  HGB 13.9  HCT 39.1  MCV 87.9  PLT 762   Basic Metabolic Panel:  Recent Labs Lab 05/13/16 0302  NA 121*  K 2.5*  CL 85*  CO2 24  GLUCOSE 137*  BUN 10  CREATININE 0.53  CALCIUM 8.7*  MG 1.4*   GFR: CrCl cannot be calculated (Unknown ideal weight.). Liver Function Tests:  Recent Labs Lab 05/13/16 0302  AST 31  ALT 17  ALKPHOS 57  BILITOT 1.7*  PROT 6.5  ALBUMIN 4.0    Recent Labs Lab 05/13/16 0302  LIPASE 15   No results for input(s): AMMONIA in the last 168 hours. Coagulation Profile: No results for input(s): INR, PROTIME in the last 168 hours. Cardiac Enzymes: No results for input(s): CKTOTAL, CKMB, CKMBINDEX, TROPONINI in the last 168 hours. BNP (last 3 results) No results for input(s): PROBNP in the last 8760 hours. HbA1C: No results for input(s): HGBA1C in the last 72 hours. CBG: No results for input(s): GLUCAP in the last 168 hours. Lipid Profile: No results for input(s): CHOL, HDL, LDLCALC, TRIG, CHOLHDL, LDLDIRECT in the last 72 hours. Thyroid Function Tests: No results for input(s): TSH, T4TOTAL, FREET4, T3FREE, THYROIDAB in the last 72  hours. Anemia Panel: No results for input(s): VITAMINB12, FOLATE, FERRITIN, TIBC, IRON, RETICCTPCT in the last 72 hours. Urine analysis:    Component Value Date/Time   COLORURINE YELLOW 05/13/2016 0302   APPEARANCEUR CLEAR 05/13/2016 0302   LABSPEC 1.011 05/13/2016 0302   PHURINE 7.0 05/13/2016 0302   GLUCOSEU NEGATIVE 05/13/2016 0302   HGBUR NEGATIVE 05/13/2016 0302   BILIRUBINUR NEGATIVE 05/13/2016 0302   KETONESUR NEGATIVE 05/13/2016 0302   PROTEINUR NEGATIVE 05/13/2016 0302   UROBILINOGEN 0.2 05/16/2013 2249   NITRITE NEGATIVE 05/13/2016 0302   LEUKOCYTESUR NEGATIVE 05/13/2016 0302    Creatinine Clearance: CrCl cannot be calculated (Unknown ideal weight.).  Sepsis Labs: @LABRCNTIP (procalcitonin:4,lacticidven:4) )No results found for this or any previous visit (from the past 240 hour(s)).   Radiological Exams on Admission: Ct Abdomen Pelvis  W Contrast  Result Date: 05/13/2016 CLINICAL DATA:  83 y/o F; general abdominal pain and lower back pain. History of colon cancer post resection and history of lymphoma. EXAM: CT ABDOMEN AND PELVIS WITH CONTRAST TECHNIQUE: Multidetector CT imaging of the abdomen and pelvis was performed using the standard protocol following bolus administration of intravenous contrast. CONTRAST:  146m ISOVUE-300 IOPAMIDOL (ISOVUE-300) INJECTION 61% COMPARISON:  12/28/2015 CT of the abdomen and pelvis. FINDINGS: Lower chest: Centrilobular emphysema. Small stable right-sided Bochdalek hernia containing fat. Hepatobiliary: Multiple stable fluid attenuating foci within the liver the largest in segment 3 and segment 4B compatible with cysts. Normal gallbladder. No intra or extrahepatic biliary ductal dilatation. Pancreas: Unremarkable. No pancreatic ductal dilatation or surrounding inflammatory changes. Spleen: Normal in size without focal abnormality. Adrenals/Urinary Tract: Multiple lucencies in the kidneys bilaterally are stable from the prior study,  well-circumscribed, and demonstrates fluid attenuation compatible with multiple cysts. The largest arises from the lower pole of left kidney and measures up to 4.9 cm. No urinary stone disease, hydronephrosis, or obstructive uropathy identified. Stomach/Bowel: Partial colectomy with anastomosis in the right lower quadrant. No inflammatory or obstructive changes of the bowel. Small hiatal hernia. Moderate volume of stool in the rectum. Vascular/Lymphatic: Extensive calcific atherosclerosis of the aorta with shaggy fibrofatty plaque. Mixed plaque of the SMA origin with short segment of severe stenosis (series 5, image 39). 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 Bilateral common iliac artery dense calcified plaque with short segments of moderate to severe stenosis. No lymphadenopathy. Reproductive: Status post hysterectomy. No adnexal masses. Other: No abdominal wall hernia or abnormality. No abdominopelvic ascites. Musculoskeletal: Partially visualize right proximal femur intramedullary nail and proximal threaded screw. Grade 1 C3-4 anterolisthesis is stable. Multilevel degenerative changes of the lumbar spine with disc space narrowing severe at L4-5 and lower lumbar facet arthrosis. At least moderate canal stenosis at the L3-4 level. No acute osseous abnormality is identified. IMPRESSION: 1. No acute process identified as explanation for abdominal pain. 2. Extensive calcific atherosclerosis of the abdominal aorta with shaggy fibrofatty plaque and stable ectasia measuring up to 2.8 cm. 3. Mixed plaque of the superior mesenteric artery with short segment of severe proximal stenosis. 4. Multiple liver and renal cysts. 5. Small hiatal hernia. 6. Lumbar spondylosis with degenerative grade 1 anterolisthesis at C3-4 where there is at least moderate canal stenosis. Electronically Signed   By: LKristine GarbeM.D.   On: 05/13/2016 05:18    EKG:  Independently reviewed. RBBB, sinus, no ACS  Assessment/Plan Active Problems:   Colon cancer ([facility])   Rheumatoid arthritis ([facility])   HTN (hypertension)   HLD (hyperlipidemia)   Abdominal pain   Hyponatremia   Lymphoma ([facility])   Abdominal pain: likely multifactorial. CT as above. Extensive plaque formation w/ some areas of severe stenosis concerning for possible ischemia. Doubt infectious etiology. Extensive stool on imaging w/ h/o constipation (2-3BM weekly at best) may also be contributing. Metabolic derangements mentioned below may also be contributing but less likely. No SBO noted but prominent bowel gas pattern present. H/o stage IIA PT 3, PN 0, M0 invasive moderately differentiated adenocarcinoma of the ascending colon status post partial colectomy in 2014. [facility] - Lactic acid, CEA, ESR, CRP - SMOG enema - Metabolic derangement mgt as below - consider CTA of abd/plv if not improving to better assess vascular/perfusion of the GI tract.  - Reglan, Phenergan,   Hyponatremia: 121 on presentation. New  finding for pt. No medication changes. On HCTZ chronically which may be contributing. 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 [facility] Stopped rituximab some time ago. Levels seem to chronically be elevated. Diff showing Neutrophil count elevation. No overt sign of infection and AFVSS. On chronic steroids. - SPEP - CBC in am  HypoK: 2.5. Mag 1.4 - Kdur replenishment - Magnesium 2gm - BMET as above.   HTN: - continue metop, dilt - Hydralazine prn - HCTZ on hold as above  RA: at baseline - continue methotrexate, prednisone  DVT prophylaxis: Heparin  Code Status: full  Family Communication: none  Disposition Plan:  pendingi mprovement  Consults called: none  Admission status: observation    MERRELL, DAVID J MD [facility]  If 7PM-7AM, please contact night-coverage [email or web address] Password [email or web address] 05/13/2016, 12:13 PM

## 2016-05-13 NOTE — Progress Notes (Signed)
Was informed by nurse secretary that lab called for a critical.  Attempted to call was put on hold for 5 minutes.Will attempt again.

## 2016-05-14 DIAGNOSIS — Z880 Allergy status to penicillin: Secondary | ICD-10-CM | POA: Diagnosis not present

## 2016-05-14 DIAGNOSIS — Z79899 Other long term (current) drug therapy: Secondary | ICD-10-CM | POA: Diagnosis not present

## 2016-05-14 DIAGNOSIS — Z85038 Personal history of other malignant neoplasm of large intestine: Secondary | ICD-10-CM | POA: Diagnosis not present

## 2016-05-14 DIAGNOSIS — E871 Hypo-osmolality and hyponatremia: Principal | ICD-10-CM

## 2016-05-14 DIAGNOSIS — R1030 Lower abdominal pain, unspecified: Secondary | ICD-10-CM | POA: Diagnosis present

## 2016-05-14 DIAGNOSIS — Z7982 Long term (current) use of aspirin: Secondary | ICD-10-CM | POA: Diagnosis not present

## 2016-05-14 DIAGNOSIS — K5641 Fecal impaction: Secondary | ICD-10-CM | POA: Diagnosis present

## 2016-05-14 DIAGNOSIS — Z7983 Long term (current) use of bisphosphonates: Secondary | ICD-10-CM | POA: Diagnosis not present

## 2016-05-14 DIAGNOSIS — M545 Low back pain: Secondary | ICD-10-CM | POA: Diagnosis present

## 2016-05-14 DIAGNOSIS — R109 Unspecified abdominal pain: Secondary | ICD-10-CM | POA: Diagnosis not present

## 2016-05-14 DIAGNOSIS — I10 Essential (primary) hypertension: Secondary | ICD-10-CM | POA: Diagnosis present

## 2016-05-14 DIAGNOSIS — E785 Hyperlipidemia, unspecified: Secondary | ICD-10-CM | POA: Diagnosis present

## 2016-05-14 DIAGNOSIS — Z8261 Family history of arthritis: Secondary | ICD-10-CM | POA: Diagnosis not present

## 2016-05-14 DIAGNOSIS — T502X5A Adverse effect of carbonic-anhydrase inhibitors, benzothiadiazides and other diuretics, initial encounter: Secondary | ICD-10-CM | POA: Diagnosis present

## 2016-05-14 DIAGNOSIS — C8590 Non-Hodgkin lymphoma, unspecified, unspecified site: Secondary | ICD-10-CM | POA: Diagnosis present

## 2016-05-14 DIAGNOSIS — D72829 Elevated white blood cell count, unspecified: Secondary | ICD-10-CM | POA: Diagnosis present

## 2016-05-14 DIAGNOSIS — M069 Rheumatoid arthritis, unspecified: Secondary | ICD-10-CM | POA: Diagnosis present

## 2016-05-14 DIAGNOSIS — E876 Hypokalemia: Secondary | ICD-10-CM | POA: Diagnosis not present

## 2016-05-14 DIAGNOSIS — T380X5A Adverse effect of glucocorticoids and synthetic analogues, initial encounter: Secondary | ICD-10-CM | POA: Diagnosis present

## 2016-05-14 DIAGNOSIS — Z7952 Long term (current) use of systemic steroids: Secondary | ICD-10-CM | POA: Diagnosis not present

## 2016-05-14 DIAGNOSIS — M810 Age-related osteoporosis without current pathological fracture: Secondary | ICD-10-CM | POA: Diagnosis present

## 2016-05-14 DIAGNOSIS — Z9049 Acquired absence of other specified parts of digestive tract: Secondary | ICD-10-CM | POA: Diagnosis not present

## 2016-05-14 LAB — CBC WITH DIFFERENTIAL/PLATELET
BASOS PCT: 0 %
Basophils Absolute: 0 10*3/uL (ref 0.0–0.1)
EOS ABS: 0 10*3/uL (ref 0.0–0.7)
EOS PCT: 0 %
HCT: 38.1 % (ref 36.0–46.0)
Hemoglobin: 12.8 g/dL (ref 12.0–15.0)
LYMPHS ABS: 0.7 10*3/uL (ref 0.7–4.0)
Lymphocytes Relative: 3 %
MCH: 30 pg (ref 26.0–34.0)
MCHC: 33.6 g/dL (ref 30.0–36.0)
MCV: 89.4 fL (ref 78.0–100.0)
Monocytes Absolute: 2 10*3/uL — ABNORMAL HIGH (ref 0.1–1.0)
Monocytes Relative: 9 %
NEUTROS PCT: 88 %
Neutro Abs: 18.7 10*3/uL — ABNORMAL HIGH (ref 1.7–7.7)
PLATELETS: 261 10*3/uL (ref 150–400)
RBC: 4.26 MIL/uL (ref 3.87–5.11)
RDW: 14.2 % (ref 11.5–15.5)
WBC: 21.5 10*3/uL — AB (ref 4.0–10.5)

## 2016-05-14 LAB — PROTEIN ELECTROPHORESIS, SERUM
A/G RATIO SPE: 1.7 (ref 0.7–1.7)
ALBUMIN ELP: 3.3 g/dL (ref 2.9–4.4)
ALPHA-1-GLOBULIN: 0.3 g/dL (ref 0.0–0.4)
Alpha-2-Globulin: 0.7 g/dL (ref 0.4–1.0)
BETA GLOBULIN: 0.7 g/dL (ref 0.7–1.3)
Gamma Globulin: 0.2 g/dL — ABNORMAL LOW (ref 0.4–1.8)
Globulin, Total: 1.9 g/dL — ABNORMAL LOW (ref 2.2–3.9)
M-Spike, %: 0.1 g/dL — ABNORMAL HIGH
TOTAL PROTEIN ELP: 5.2 g/dL — AB (ref 6.0–8.5)

## 2016-05-14 LAB — URINALYSIS, ROUTINE W REFLEX MICROSCOPIC
Bilirubin Urine: NEGATIVE
Glucose, UA: NEGATIVE mg/dL
Ketones, ur: NEGATIVE mg/dL
Nitrite: NEGATIVE
Protein, ur: 30 mg/dL — AB
Specific Gravity, Urine: 1.023 (ref 1.005–1.030)
Squamous Epithelial / HPF: NONE SEEN
pH: 6 (ref 5.0–8.0)

## 2016-05-14 LAB — OSMOLALITY, URINE
Osmolality, Ur: 415 mOsm/kg (ref 300–900)
Osmolality, Ur: 140 mOsm/kg — ABNORMAL LOW (ref 300–900)

## 2016-05-14 LAB — BASIC METABOLIC PANEL
Anion gap: 8 (ref 5–15)
Anion gap: 7 (ref 5–15)
BUN: 7 mg/dL (ref 6–20)
BUN: 6 mg/dL (ref 6–20)
CALCIUM: 7.6 mg/dL — AB (ref 8.9–10.3)
CALCIUM: 7.8 mg/dL — AB (ref 8.9–10.3)
CHLORIDE: 92 mmol/L — AB (ref 101–111)
CO2: 24 mmol/L (ref 22–32)
CO2: 23 mmol/L (ref 22–32)
CREATININE: 0.57 mg/dL (ref 0.44–1.00)
Chloride: 95 mmol/L — ABNORMAL LOW (ref 101–111)
Creatinine, Ser: 0.64 mg/dL (ref 0.44–1.00)
GFR calc Af Amer: >60 mL/min (ref >60)
GLUCOSE: 132 mg/dL — AB (ref 65–99)
Glucose, Bld: 114 mg/dL — ABNORMAL HIGH (ref 65–99)
POTASSIUM: 4.1 mmol/L (ref 3.5–5.1)
Potassium: 2.9 mmol/L — ABNORMAL LOW (ref 3.5–5.1)
SODIUM: 124 mmol/L — AB (ref 135–145)
Sodium: 125 mmol/L — ABNORMAL LOW (ref 135–145)

## 2016-05-14 LAB — PHOSPHORUS: PHOSPHORUS: 1.9 mg/dL — AB (ref 2.5–4.6)

## 2016-05-14 LAB — NA AND K (SODIUM & POTASSIUM), RAND UR
Potassium Urine: 8 mmol/L
Sodium, Ur: <10 mmol/L

## 2016-05-14 LAB — MAGNESIUM: MAGNESIUM: 2.1 mg/dL (ref 1.7–2.4)

## 2016-05-14 LAB — CEA: CEA: 2.1 ng/mL (ref 0.0–4.7)

## 2016-05-14 MED ORDER — POTASSIUM CHLORIDE 10 MEQ/100ML IV SOLN
10.0000 meq | INTRAVENOUS | Status: AC
  Administered 2016-05-14 (×4): 10 meq via INTRAVENOUS
  Filled 2016-05-14 (×4): qty 100

## 2016-05-14 MED ORDER — POTASSIUM CHLORIDE IN NACL 20-0.9 MEQ/L-% IV SOLN
INTRAVENOUS | Status: DC
  Administered 2016-05-14: 09:00:00 via INTRAVENOUS
  Filled 2016-05-14 (×3): qty 1000

## 2016-05-14 NOTE — Progress Notes (Signed)
PROGRESS NOTE    Rebecca Hodges  OAC:[id number] DOB: [birth year] DOA: 05/13/2016 PCP: Pcp Not In System   Brief Narrative:83 y.o. female with medical history significant of hypertension, lymphoma, rheumatoid arthritis, colon cancer, presenting w/ abdominal pain. Lower abdomen with radiation to the back. Started approximately one day prior to admission.  Assessment & Plan:  # Abdomen pain, unknown etiology: -CT abdomen pelvis with aortic atherosclerotic disease and superior mesenteric artery. No acute finding to explain the abdomen pain. Lactate level not elevated (0.7).  -abdominal pain, nausea or vomiting improving. Advance diet to soft today. -Patient with has history of ascending colon adenocarcinoma status post surgery. Patient followed up with [facility] oncology team. Recommended outpatient follow-up.  # Hyponatremia and hypokalemia likely in the setting of hydrochlorothiazide: Repleting potassium chloride and normal saline IV fluid. Repeat lab. Magnesium level acceptable. Check urine electrolytes and urine osmolality. -hold HCTZ  #Chronic leukocytosis: Patient is on chronic prednisone. Continue to monitor.  #Hypertension: Continue metoprolol and diltiazem. Monitor blood pressure and heart rate.  #History of rheumatoid arthritis: Continue methotrexate, prednisone. Recommended outpatient follow-up.  Active Problems:   Colon cancer ([place])   Rheumatoid arthritis ([place])   HTN (hypertension)   HLD (hyperlipidemia)   Abdominal pain   Hyponatremia   Lymphoma ([facility])  DVT prophylaxis: Heparin subcutaneous Code Status: Full code Family Communication: No family present at bedside Disposition Plan: Likely discharge home in 1-2 days    Consultants:   None  Procedures: None Antimicrobials: None  Subjective: Patient was seen and examined at bedside. Reported that her nausea vomiting and abdominal pain is better. Denied chest pain or shortness of breath.  Objective: Vitals:   05/13/16 2126 05/14/16 0600 05/14/16 1058 05/14/16 1112  BP: 129/89 (!) 124/55  (!) 125/55  Pulse: 67 69    Resp: 18 16    Temp: 99 F (37.2 C) 98 F (36.7 C)    TempSrc: Oral Oral    SpO2: 94% 95%    Weight:      Height:   5\' 5"  (1.651 m)     Intake/Output Summary (Last 24 hours) at 05/14/16 1501 Last data filed at 05/14/16 0700  Gross per 24 hour  Intake          1170.35 ml  Output              725 ml  Net           445.35 ml   Filed Weights   05/13/16 0255  Weight: 65.8 kg (145 lb)    Examination:  General exam: Appears calm and comfortable  Respiratory system: Clear to auscultation. Respiratory effort normal. No wheezing or crackle Cardiovascular system: S1 & S2 heard, RRR.  No pedal edema. Gastrointestinal system: Abdomen is nondistended, soft and nontender. Normal bowel sounds heard. Central nervous system: Alert and oriented. No focal neurological deficits. Extremities: Symmetric 5 x 5 power. Skin: No rashes, lesions or ulcers Psychiatry: Judgement and insight appear normal. Mood & affect appropriate.     Data Reviewed: I have personally reviewed following labs and imaging studies  CBC:  Recent Labs Lab 05/13/16 0302 05/14/16 0409  WBC 22.5* 21.5*  NEUTROABS 19.7* 18.7*  HGB 13.9 12.8  HCT 39.1 38.1  MCV 87.9 89.4  PLT 287 010   Basic Metabolic Panel:  Recent Labs Lab 05/13/16 0302 05/13/16 1400 05/13/16 1907 05/14/16 0409  NA 121* 123* 121* 124*  K 2.5* 2.7* 2.6* 2.9*  CL 85* 88* 87* 92*  CO2 24 24  25 24  GLUCOSE 137* 126* 133* 114*  BUN 10 5* 7 7  CREATININE 0.53 0.49 0.60 0.57  CALCIUM 8.7* 7.8* 7.6* 7.6*  MG 1.4*  --   --  2.1  PHOS  --   --   --  1.9*   GFR: Estimated Creatinine Clearance: 47.1 mL/min (by C-G formula based on SCr of 0.57 mg/dL). Liver Function Tests:  Recent Labs Lab 05/13/16 0302  AST 31  ALT 17  ALKPHOS 57  BILITOT 1.7*  PROT 6.5  ALBUMIN 4.0    Recent Labs Lab 05/13/16 0302  LIPASE 15   No  results for input(s): AMMONIA in the last 168 hours. Coagulation Profile: No results for input(s): INR, PROTIME in the last 168 hours. Cardiac Enzymes: No results for input(s): CKTOTAL, CKMB, CKMBINDEX, TROPONINI in the last 168 hours. BNP (last 3 results) No results for input(s): PROBNP in the last 8760 hours. HbA1C: No results for input(s): HGBA1C in the last 72 hours. CBG: No results for input(s): GLUCAP in the last 168 hours. Lipid Profile: No results for input(s): CHOL, HDL, LDLCALC, TRIG, CHOLHDL, LDLDIRECT in the last 72 hours. Thyroid Function Tests: No results for input(s): TSH, T4TOTAL, FREET4, T3FREE, THYROIDAB in the last 72 hours. Anemia Panel: No results for input(s): VITAMINB12, FOLATE, FERRITIN, TIBC, IRON, RETICCTPCT in the last 72 hours. Sepsis Labs:  Recent Labs Lab 05/13/16 1400 05/13/16 1609  LATICACIDVEN 0.9 0.7    No results found for this or any previous visit (from the past 240 hour(s)).       Radiology Studies: Ct Abdomen Pelvis W Contrast  Result Date: 05/13/2016 CLINICAL DATA:  83 y/o F; general abdominal pain and lower back pain. History of colon cancer post resection and history of lymphoma. EXAM: CT ABDOMEN AND PELVIS WITH CONTRAST TECHNIQUE: Multidetector CT imaging of the abdomen and pelvis was performed using the standard protocol following bolus administration of intravenous contrast. CONTRAST:  152mL ISOVUE-300 IOPAMIDOL (ISOVUE-300) INJECTION 61% COMPARISON:  12/28/2015 CT of the abdomen and pelvis. FINDINGS: Lower chest: Centrilobular emphysema. Small stable right-sided Bochdalek hernia containing fat. Hepatobiliary: Multiple stable fluid attenuating foci within the liver the largest in segment 3 and segment 4B compatible with cysts. Normal gallbladder. No intra or extrahepatic biliary ductal dilatation. Pancreas: Unremarkable. No pancreatic ductal dilatation or surrounding inflammatory changes. Spleen: Normal in size without focal abnormality.  Adrenals/Urinary Tract: Multiple lucencies in the kidneys bilaterally are stable from the prior study, well-circumscribed, and demonstrates fluid attenuation compatible with multiple cysts. The largest arises from the lower pole of left kidney and measures up to 4.9 cm. No urinary stone disease, hydronephrosis, or obstructive uropathy identified. Stomach/Bowel: Partial colectomy with anastomosis in the right lower quadrant. No inflammatory or obstructive changes of the bowel. Small hiatal hernia. Moderate volume of stool in the rectum. Vascular/Lymphatic: Extensive calcific atherosclerosis of the aorta with shaggy fibrofatty plaque. Mixed plaque of the SMA origin with short segment of severe stenosis (series 5, image 39). 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 Bilateral common iliac artery dense calcified plaque with short segments of moderate to severe stenosis. No lymphadenopathy. Reproductive: Status post hysterectomy. No adnexal masses. Other: No abdominal wall hernia or abnormality. No abdominopelvic ascites. Musculoskeletal: Partially visualize right proximal femur intramedullary nail and proximal threaded screw. Grade 1 C3-4 anterolisthesis is stable. Multilevel degenerative changes of the lumbar spine with disc space narrowing severe at L4-5 and lower lumbar facet arthrosis. At least moderate  canal stenosis at the L3-4 level. No acute osseous abnormality is identified. IMPRESSION: 1. No acute process identified as explanation for abdominal pain. 2. Extensive calcific atherosclerosis of the abdominal aorta with shaggy fibrofatty plaque and stable ectasia measuring up to 2.8 cm. 3. Mixed plaque of the superior mesenteric artery with short segment of severe proximal stenosis. 4. Multiple liver and renal cysts. 5. Small hiatal hernia. 6. Lumbar spondylosis with degenerative grade 1 anterolisthesis at C3-4 where there is at least moderate canal  stenosis. Electronically Signed   By: Kristine Garbe M.D.   On: 05/13/2016 05:18        Scheduled Meds: . aspirin EC  81 mg Oral Daily  . diltiazem  240 mg Oral Daily  . heparin  5,000 Units Subcutaneous Q8H  . methotrexate  15 mg Oral Weekly  . metoCLOPramide (REGLAN) injection  10 mg Intravenous Q12H  . metoprolol succinate  75 mg Oral Daily  . potassium chloride  10 mEq Intravenous Q1 Hr x 4  . potassium chloride SA  20 mEq Oral BID  . predniSONE  7.5 mg Oral Q breakfast  . simvastatin  10 mg Oral Daily   Continuous Infusions: . 0.9 % NaCl with KCl 20 mEq / L 100 mL/hr at 05/14/16 0919     LOS: 0 days    Tiwanna Tuch Tanna Furry, MD [facility] Pager [phone]  If 7PM-7AM, please contact night-coverage [email or web address] Password [facility] 05/14/2016, 3:01 PM

## 2016-05-14 NOTE — Progress Notes (Signed)
   05/13/16 1640  Clinical Encounter Type  Visited With Family  Visit Type Follow-up  Referral From Other (Comment) (Nursing home)  Spiritual Encounters  Spiritual Needs Emotional  Stress Factors  Patient Stress Factors Not reviewed  Family Stress Factors None identified  Pastoral care secretary received call from nursing home on need for an [employer] priest. Martin Majestic to Pt's room and encountered Pt's daughter. Pt's daughter reported priest not needed. She wanted paperwork from nursing home.

## 2016-05-15 LAB — BASIC METABOLIC PANEL
Anion gap: 7 (ref 5–15)
BUN: <5 mg/dL — ABNORMAL LOW (ref 6–20)
CO2: 24 mmol/L (ref 22–32)
Calcium: 7.8 mg/dL — ABNORMAL LOW (ref 8.9–10.3)
Chloride: 102 mmol/L (ref 101–111)
Creatinine, Ser: 0.53 mg/dL (ref 0.44–1.00)
GFR calc non Af Amer: >60 mL/min (ref >60)
GLUCOSE: 95 mg/dL (ref 65–99)
Potassium: 3.7 mmol/L (ref 3.5–5.1)
Sodium: 133 mmol/L — ABNORMAL LOW (ref 135–145)

## 2016-05-15 MED ORDER — POTASSIUM CHLORIDE ER 10 MEQ PO TBCR: 20.0000 meq | EXTENDED_RELEASE_TABLET | Freq: Every day | ORAL | 0 refills | Status: DC

## 2016-05-15 MED ORDER — AMLODIPINE BESYLATE 5 MG PO TABS: 5.0000 mg | ORAL_TABLET | Freq: Every day | ORAL | Status: DC

## 2016-05-15 MED ORDER — AMLODIPINE BESYLATE 5 MG PO TABS: 5.0000 mg | ORAL_TABLET | Freq: Every day | ORAL | 0 refills | Status: DC

## 2016-05-15 NOTE — Discharge Summary (Addendum)
Physician Discharge Summary  Rebecca Hodges ALP:[id number] DOB: 08-Mar-1929 DOA: 05/13/2016  PCP: Pcp Not In System  Admit date: 05/13/2016 Discharge date: 05/15/2016  Admitted From:home Disposition: home  Recommendations for Outpatient Follow-up:  1. Follow up with PCP in 1-2 weeks 2. Please obtain BMP/CBC in one week  Home Health:yes Equipment/Devices:no Discharge Condition:stable CODE STATUS:full code Diet recommendation:heart healthy  Brief/Interim Summary:83 y.o.femalewith medical history significant of hypertension, lymphoma, rheumatoid arthritis, colon cancer, presenting w/ abdominal pain. Lower abdomen with radiation to the back. Started approximately one day prior to admission.  # Abdomen pain, unknown etiology: -CT abdomen pelvis with aortic atherosclerotic disease and superior mesenteric artery. No acute finding to explain the abdomen pain. Lactate level not elevated (0.7).  -Patient's abdomen pain improved. Denies nausea and vomiting. Tolerated diet well. -Patient with has history of ascending colon adenocarcinoma status post surgery. Patient followed up with oncologist. Recommended outpatient follow-up.  # Hyponatremia and hypokalemia likely in the setting of hydrochlorothiazide: Repleted potassium chloride and IV fluid with improvement in electrolytes. Discharge with oral potassium chloride and recommended to monitor labs outpatient.  #Chronic leukocytosis: Patient is on chronic prednisone. Continue to monitor.  #Hypertension: Continue metoprolol and diltiazem. Dc hctz as discussed above. Recommended to monitor blood pressure and heart rate at home.  #History of rheumatoid arthritis: Continue methotrexate, prednisone. Recommended outpatient follow-up.  Patient is clinically improved. Able to tolerate diet. Recommended outpatient follow-up with oncologist the possibility with GI for repeat colonoscopy. Patient verbalized understanding. Medically stable to  discharge today with outpatient follow-up.  Discharge Diagnoses:  Active Problems:   Colon cancer (HCC)   Rheumatoid arthritis (HCC)   HTN (hypertension)   HLD (hyperlipidemia)   Abdominal pain   Acute hyponatremia   Lymphoma (HCC)   Acute hypokalemia   Hyponatremia    Discharge Instructions  Discharge Instructions    Call MD for:  difficulty breathing, headache or visual disturbances    Complete by:  As directed    Call MD for:  extreme fatigue    Complete by:  As directed    Call MD for:  hives    Complete by:  As directed    Call MD for:  persistant dizziness or light-headedness    Complete by:  As directed    Call MD for:  persistant nausea and vomiting    Complete by:  As directed    Call MD for:  severe uncontrolled pain    Complete by:  As directed    Call MD for:  temperature >100.4    Complete by:  As directed    Diet - low sodium heart healthy    Complete by:  As directed    Discharge instructions    Complete by:  As directed    Please follow up with your PCP in 1 weeks,  Also follow up with your oncologist in 2-3 weeks,  Please check BP, HR at home and repeat labs (CBC, BMP) in one week with your PCP.   Face-to-face encounter (required for Medicare/Medicaid patients)    Complete by:  As directed    I Lorean Ekstrand Tanna Furry certify that this patient is under my care and that I, or a nurse practitioner or physician's assistant working with me, had a face-to-face encounter that meets the physician face-to-face encounter requirements with this patient on 05/15/2016. The encounter with the patient was in whole, or in part for the following medical condition(s) which is the primary reason for home health care (List medical condition):weakness,  hyponatremia   The encounter with the patient was in whole, or in part, for the following medical condition, which is the primary reason for home health care:  weakness, hyponatremia   I certify that, based on my findings, the  following services are medically necessary home health services:   Nursing Physical therapy     Reason for Medically Necessary Home Health Services:  Skilled Nursing- Skilled Assessment/Observation   My clinical findings support the need for the above services:  Unable to leave home safely without assistance and/or assistive device   Further, I certify that my clinical findings support that this patient is homebound due to:  Unable to leave home safely without assistance   Home Health    Complete by:  As directed    To provide the following care/treatments:   PT [place]     Increase activity slowly    Complete by:  As directed      Allergies as of 05/15/2016      Reactions   Penicillins    Unknown (tolerated cefepime 05/18/13 - Thuy)      Medication List    STOP taking these medications   benzonatate 100 MG capsule Commonly known as:  TESSALON   chlorpheniramine-HYDROcodone 10-8 MG/5ML Lqcr Commonly known as:  TUSSIONEX   fluticasone 50 MCG/ACT nasal spray Commonly known as:  FLONASE   hydrochlorothiazide 25 MG tablet Commonly known as:  HYDRODIURIL   levofloxacin 500 MG tablet Commonly known as:  LEVAQUIN     TAKE these medications   alendronate 70 MG tablet Commonly known as:  FOSAMAX Take 70 mg by mouth once a week. Take with a full glass of water on an empty stomach.   aspirin 81 MG tablet Take 81 mg by mouth daily.   calcium carbonate 1500 (600 Ca) MG Tabs tablet Commonly known as:  OSCAL Take 1,500 mg by mouth 2 (two) times daily with a meal.   clobetasol cream 0.05 % Commonly known as:  TEMOVATE Apply 1 application topically 2 (two) times daily.   diltiazem 240 MG 24 hr capsule Commonly known as:  CARDIZEM CD Take 240 mg by mouth daily.   folic acid 1 MG tablet Commonly known as:  FOLVITE Take 1 mg by mouth daily.   methotrexate 2.5 MG tablet Take 15 mg by mouth once a week.   metoprolol succinate 25 MG 24 hr tablet Commonly known  as:  TOPROL-XL Take 75 mg by mouth daily.   potassium chloride 10 MEQ tablet Commonly known as:  K-DUR Take 2 tablets (20 mEq total) by mouth daily.   potassium chloride SA 20 MEQ tablet Commonly known as:  K-DUR,KLOR-CON Take 20 mEq by mouth 2 (two) times daily.   predniSONE 5 MG tablet Commonly known as:  DELTASONE Take 7.5 mg by mouth daily with breakfast.   simvastatin 10 MG tablet Commonly known as:  ZOCOR Take 10 mg by mouth daily.      Follow-up Information    [facility]. Schedule an appointment as soon as possible for a visit in 1 week(s).   Why:  or follow up with your PCP in one week. Contact information: [place] [phone]         Allergies  Allergen Reactions  . Penicillins     Unknown (tolerated cefepime 05/18/13 - Thuy)    Consultations: None  Procedures/Studies: None  Subjective: Patient was seen and examined at bedside.  Patient reported feeling much better. Denied nausea, vomiting, abdominal pain. Tolerated diet well. Denied dysuria, urgency or frequency. No chest pain or shortness of breath.   Discharge Exam: Vitals:   05/15/16 0518 05/15/16 0941  BP: (!) 148/58 (!) 163/74  Pulse: 72   Resp: 18   Temp: 98.1 F (36.7 C)    Vitals:   05/14/16 1622 05/14/16 2026 05/15/16 0518 05/15/16 0941  BP: (!) 151/58 (!) 123/53 (!) 148/58 (!) 163/74  Pulse: 64 (!) 58 72   Resp: 18 18 18    Temp: 97.9 F (36.6 C) 97.5 F (36.4 C) 98.1 F (36.7 C)   TempSrc:      SpO2: 94% 98% 99%   Weight:      Height:        General: Pt is alert, awake, not in acute distress Cardiovascular: RRR, S1/S2 +, no rubs, no gallops Respiratory: CTA bilaterally, no wheezing, no rhonchi Abdominal: Soft, NT, ND, bowel sounds + Extremities: no edema, no cyanosis    The results of significant diagnostics from this hospitalization (including imaging, microbiology, ancillary and laboratory)  are listed below for reference.     Microbiology: No results found for this or any previous visit (from the past 240 hour(s)).   Labs: BNP (last 3 results) No results for input(s): BNP in the last 8760 hours. Basic Metabolic Panel:  Recent Labs Lab 05/13/16 0302 05/13/16 1400 05/13/16 1907 05/14/16 0409 05/14/16 1508 05/15/16 0647  NA 121* 123* 121* 124* 125* 133*  K 2.5* 2.7* 2.6* 2.9* 4.1 3.7  CL 85* 88* 87* 92* 95* 102  CO2 24 24 25 24 23 24   GLUCOSE 137* 126* 133* 114* 132* 95  BUN 10 5* 7 7 6  <5*  CREATININE 0.53 0.49 0.60 0.57 0.64 0.53  CALCIUM 8.7* 7.8* 7.6* 7.6* 7.8* 7.8*  MG 1.4*  --   --  2.1  --   --   PHOS  --   --   --  1.9*  --   --    Liver Function Tests:  Recent Labs Lab 05/13/16 0302  AST 31  ALT 17  ALKPHOS 57  BILITOT 1.7*  PROT 6.5  ALBUMIN 4.0    Recent Labs Lab 05/13/16 0302  LIPASE 15   No results for input(s): AMMONIA in the last 168 hours. CBC:  Recent Labs Lab 05/13/16 0302 05/14/16 0409  WBC 22.5* 21.5*  NEUTROABS 19.7* 18.7*  HGB 13.9 12.8  HCT 39.1 38.1  MCV 87.9 89.4  PLT 287 261   Cardiac Enzymes: No results for input(s): CKTOTAL, CKMB, CKMBINDEX, TROPONINI in the last 168 hours. BNP: Invalid input(s): POCBNP CBG: No results for input(s): GLUCAP in the last 168 hours. D-Dimer No results for input(s): DDIMER in the last 72 hours. Hgb A1c No results for input(s): HGBA1C in the last 72 hours. Lipid Profile No results for input(s): CHOL, HDL, LDLCALC, TRIG, CHOLHDL, LDLDIRECT in the last 72 hours. Thyroid function studies No results for input(s): TSH, T4TOTAL, T3FREE, THYROIDAB in the last 72 hours.  Invalid input(s): FREET3 Anemia work up No results for input(s): VITAMINB12, FOLATE, FERRITIN, TIBC, IRON, RETICCTPCT in the last 72 hours. Urinalysis    Component Value Date/Time   COLORURINE AMBER (A) 05/14/2016 0445   APPEARANCEUR CLOUDY (A) 05/14/2016 0445   LABSPEC 1.023 05/14/2016 0445   PHURINE 6.0  05/14/2016 0445   GLUCOSEU NEGATIVE 05/14/2016 0445   HGBUR MODERATE (A) 05/14/2016 0445   BILIRUBINUR NEGATIVE 05/14/2016 0445   KETONESUR NEGATIVE 05/14/2016  [place] (A) 05/14/2016 0445   UROBILINOGEN 0.2 05/16/2013 2249   NITRITE NEGATIVE 05/14/2016 0445   LEUKOCYTESUR LARGE (A) 05/14/2016 0445   Sepsis Labs Invalid input(s): PROCALCITONIN,  WBC,  LACTICIDVEN Microbiology No results found for this or any previous visit (from the past 240 hour(s)).   Time coordinating discharge: 26 minutes  SIGNED:   Rosita Fire, MD  [facility] 05/15/2016, 1:23 PM  If 7PM-7AM, please contact night-coverage [email or web address] Password [email or web address]

## 2016-05-15 NOTE — Progress Notes (Signed)
Rebecca Hodges to be D/C'd Home per MD order.  Discussed with the patient and all questions fully answered.  VSS, Skin clean, dry and intact without evidence of skin break down, no evidence of skin tears noted. IV catheter discontinued intact. Site without signs and symptoms of complications. Dressing and pressure applied.  An After Visit Summary was printed and given to the patient. Patient received prescription.  D/c education completed with patient/family including follow up instructions, medication list, d/c activities limitations if indicated, with other d/c instructions as indicated by MD - patient able to verbalize understanding, all questions fully answered.   Patient instructed to return to ED, call 911, or call MD for any changes in condition.   Patient escorted via [place], and D/C home via private auto.  Luci Bank 05/15/2016 6:32 PM

## 2016-05-15 NOTE — Care Management Note (Signed)
Case Management Note  Patient Details  Name: Rebecca Hodges MRN: [id number] Date of Birth: 08-28-1929  Subjective/Objective:        Admitted with abd pain, hx of hypertension, lymphoma, rheumatoid arthritis, colon cancer, presenting w/ abdominal pain. Lower abdomen with radiation to the back. From [name].           Action/Plan: Plan is to d/c to home today pending urination. Nurse to call [facility] for transportation to home @ [phone]. Daughter to receive pt on arrival to home.  Expected Discharge Date:  05/15/16               Expected Discharge Plan:  [place]  In-House Referral:     Discharge planning Services  CM Consult  Post Acute Care Choice:    Choice offered to:  Patient  DME Arranged:  3-N-1 DME Agency:  [employer] daughter will pick up equipment from home store.  HH Arranged:  PT, RN, Nurse's Aide [place] Agency:   [facility]/ referral made with [name] @ [phone].  Status of Service:  Completed, signed off  If discussed at [place] of Stay Meetings, dates discussed:    Additional Comments:  [name], RN 05/15/2016, 3:26 PM

## 2016-05-15 NOTE — Care Management Note (Signed)
Case Management Note  Patient Details  Name: Rebecca Hodges MRN: [id number] Date of Birth: [birth year]  Subjective/Objective:           Admitted with ABD pain, hx of hypertension, lymphoma, rheumatoid arthritis, colon cancer, presenting w/ abdominal pain. Lower abdomen with radiation to the back. From [name].      Action/Plan: Plan is to d/c to home today.  Expected Discharge Date:  05/15/16               Expected Discharge Plan:  [place]  In-House Referral:     Discharge planning Services  CM Consult  Post Acute Care Choice:    Choice offered to:  Patient  DME Arranged:    DME Agency:     HH Arranged:  PT, RN, Nurse's Aide [place] Agency:   [place], referral made to Lelan Pons @ [phone]  Status of Service:  Completed, signed off  If discussed at [place] of Stay Meetings, dates discussed:    Additional Comments:  [name], RN 05/15/2016, 2:07 PM

## 2016-05-15 NOTE — Evaluation (Signed)
Physical Therapy Evaluation Patient Details Name: Kinze Labo MRN: [id number] DOB: [birth year] Today's Date: 05/15/2016   History of Present Illness  Rebecca Hodges is a 83 y.o. female with medical history significant of hypertension, lymphoma, rheumatoid arthritis, colon cancer, presenting w/ abdominal pain.  Clinical Impression  Patient presents with decreased mobility due to general weakness and bedrest during hospitalization with deconditioning.  She states daughter to stay with her in her apartment for few days then says she can get  ALF services in her apartment.  Recommend follow up HHPT and no further skilled PT needs as d/c planned for today.    Follow Up Recommendations Home health PT;Supervision for mobility/OOB    Equipment Recommendations  None recommended by PT    Recommendations for Other Services       Precautions / Restrictions Precautions Precautions: Fall      Mobility  Bed Mobility Overal bed mobility: Needs Assistance Bed Mobility: Supine to Sit;Sit to Supine     Supine to sit: Min assist Sit to supine: Min assist   General bed mobility comments: assist to lift trunk, and A   Transfers Overall transfer level: Needs assistance Equipment used: Rolling walker (2 wheeled) Transfers: Sit to/from [facility] Sit to Stand: Min assist Stand pivot transfers: Min assist       General transfer comment: assist for lifting, balance  Ambulation/Gait Ambulation/Gait assistance: Min guard Ambulation Distance (Feet): 25 Feet Assistive device: Rolling walker (2 wheeled) Gait Pattern/deviations: Step-through pattern;Decreased stride length;Trunk flexed;Shuffle     General Gait Details: general weakness noted and pt self limited with ambulation  Stairs            Wheelchair Mobility    Modified Rankin (Stroke Patients Only)       Balance Overall balance assessment: Needs assistance   Sitting balance-Leahy Scale:  Good     Standing balance support: Single extremity supported;During functional activity Standing balance-Leahy Scale: Poor Standing balance comment: standing at sink to brush hair trading hands on sink with brush with minguard                             Pertinent Vitals/Pain Pain Assessment: Faces Faces Pain Scale: Hurts even more Pain Location: hemorrhoids Pain Descriptors / Indicators: Discomfort;Sore Pain Intervention(s): Monitored during session;Repositioned    Home Living Family/patient expects to be discharged to:: Private residence Living Arrangements: Alone Available Help at Discharge: Family;Available PRN/intermittently Type of Home: Independent living facility Home Access: Level entry     Home Layout: One level Home Equipment: Walker - 4 wheels;Shower seat - built in;Hand held shower head;Grab bars - tub/shower;Grab bars - toilet Additional Comments: lives at [facility], daughter will stay 2-3 days, visiting from [place]    Prior Function Level of Independence: Independent with assistive device(s)         Comments: walks with rollator     Hand Dominance        Extremity/Trunk Assessment   Upper Extremity Assessment Upper Extremity Assessment: Generalized weakness    Lower Extremity Assessment Lower Extremity Assessment: Generalized weakness    Cervical / Trunk Assessment Cervical / Trunk Assessment: Kyphotic  Communication   Communication: No difficulties  Cognition Arousal/Alertness: Awake/alert Behavior During Therapy: WFL for tasks assessed/performed Overall Cognitive Status: Within Functional Limits for tasks assessed  General Comments      Exercises     Assessment/Plan    PT Assessment All further PT needs can be met in the next venue of care  PT Problem List Decreased strength;Decreased activity tolerance;Decreased balance;Decreased mobility;Cardiopulmonary status  limiting activity;Decreased knowledge of use of DME       PT Treatment Interventions      PT Goals (Current goals can be found in the Care Plan section)  Acute Rehab PT Goals PT Goal Formulation: All assessment and education complete, DC therapy    Frequency     Barriers to discharge        Co-evaluation               End of Session Equipment Utilized During Treatment: Gait belt Activity Tolerance: Patient limited by fatigue Patient left: in bed;with call bell/phone within reach   PT Visit Diagnosis: Muscle weakness (generalized) (M62.81);Other abnormalities of gait and mobility (R26.89)    Time: 1412-1440 PT Time Calculation (min) (ACUTE ONLY): 28 min   Charges:   PT Evaluation $PT Eval Moderate Complexity: 1 Procedure PT Treatments $Gait Training: 8-22 mins   PT G CodesMagda Kiel, [place] [phone] 05/15/2016   Reginia Naas 05/15/2016, 2:49 PM

## 2016-05-16 LAB — OSMOLALITY: OSMOLALITY: 247 mosm/kg — AB (ref 275–295)

## 2016-05-20 DIAGNOSIS — R109 Unspecified abdominal pain: Secondary | ICD-10-CM

## 2016-05-20 DIAGNOSIS — K5641 Fecal impaction: Secondary | ICD-10-CM

## 2016-05-20 NOTE — Progress Notes (Signed)
Late entry: coding clarification.   Patient has history of lymphoma, currently follows with oncologist. Please refer to oncologist for specific lymphoma diagnosis.

## 2019-04-03 ENCOUNTER — Emergency Department (HOSPITAL_COMMUNITY)
Admission: EM | Admit: 2019-04-03 | Discharge: 2019-04-03 | Disposition: A | Discharge status: Home / Self Care | Payer: Medicare Other | Attending: Emergency Medicine | Admitting: Emergency Medicine

## 2019-04-03 ENCOUNTER — Other Ambulatory Visit: Payer: Self-pay

## 2019-04-03 ENCOUNTER — Emergency Department (HOSPITAL_COMMUNITY): Payer: Medicare Other

## 2019-04-03 ENCOUNTER — Encounter (HOSPITAL_COMMUNITY): Payer: Self-pay | Admitting: Emergency Medicine

## 2019-04-03 DIAGNOSIS — E876 Hypokalemia: Secondary | ICD-10-CM | POA: Insufficient documentation

## 2019-04-03 DIAGNOSIS — Z20822 Contact with and (suspected) exposure to covid-19: Secondary | ICD-10-CM | POA: Insufficient documentation

## 2019-04-03 DIAGNOSIS — M069 Rheumatoid arthritis, unspecified: Secondary | ICD-10-CM | POA: Diagnosis not present

## 2019-04-03 DIAGNOSIS — Z79899 Other long term (current) drug therapy: Secondary | ICD-10-CM | POA: Diagnosis not present

## 2019-04-03 DIAGNOSIS — I4891 Unspecified atrial fibrillation: Secondary | ICD-10-CM | POA: Diagnosis not present

## 2019-04-03 DIAGNOSIS — M25512 Pain in left shoulder: Secondary | ICD-10-CM | POA: Insufficient documentation

## 2019-04-03 DIAGNOSIS — I10 Essential (primary) hypertension: Secondary | ICD-10-CM | POA: Diagnosis not present

## 2019-04-03 DIAGNOSIS — I482 Chronic atrial fibrillation, unspecified: Secondary | ICD-10-CM

## 2019-04-03 LAB — COMPREHENSIVE METABOLIC PANEL
ALT: 17 U/L (ref 0–44)
AST: 21 U/L (ref 15–41)
Albumin: 3.4 g/dL — ABNORMAL LOW (ref 3.5–5.0)
Alkaline Phosphatase: 110 U/L (ref 38–126)
Anion gap: 13 (ref 5–15)
BUN: 22 mg/dL (ref 8–23)
CO2: 23 mmol/L (ref 22–32)
Calcium: 8.9 mg/dL (ref 8.9–10.3)
Chloride: 101 mmol/L (ref 98–111)
Creatinine, Ser: 0.91 mg/dL (ref 0.44–1.00)
GFR calc Af Amer: >60 mL/min (ref >60)
GFR calc non Af Amer: 57 mL/min — ABNORMAL LOW (ref >60)
Glucose, Bld: 89 mg/dL (ref 70–99)
Potassium: 3.2 mmol/L — ABNORMAL LOW (ref 3.5–5.1)
Sodium: 137 mmol/L (ref 135–145)
Total Bilirubin: 0.8 mg/dL (ref 0.3–1.2)
Total Protein: 5.9 g/dL — ABNORMAL LOW (ref 6.5–8.1)

## 2019-04-03 LAB — RESPIRATORY PANEL BY RT PCR (FLU A&B, COVID)
Influenza A by PCR: NEGATIVE
Influenza B by PCR: NEGATIVE
SARS Coronavirus 2 by RT PCR: NEGATIVE

## 2019-04-03 LAB — CBC
HCT: 41.9 % (ref 36.0–46.0)
Hemoglobin: 13.6 g/dL (ref 12.0–15.0)
MCH: 30.8 pg (ref 26.0–34.0)
MCHC: 32.5 g/dL (ref 30.0–36.0)
MCV: 94.8 fL (ref 80.0–100.0)
Platelets: 345 10*3/uL (ref 150–400)
RBC: 4.42 MIL/uL (ref 3.87–5.11)
RDW: 15.1 % (ref 11.5–15.5)
WBC: 13.9 10*3/uL — ABNORMAL HIGH (ref 4.0–10.5)
nRBC: 0 % (ref 0.0–0.2)

## 2019-04-03 LAB — TROPONIN I (HIGH SENSITIVITY)
Troponin I (High Sensitivity): 14 ng/L (ref <18)
Troponin I (High Sensitivity): 22 ng/L — ABNORMAL HIGH (ref <18)

## 2019-04-03 MED ORDER — ONDANSETRON HCL 4 MG/2ML IJ SOLN
4.0000 mg | Freq: Once | INTRAMUSCULAR | Status: AC
  Administered 2019-04-03: 4 mg via INTRAVENOUS
  Filled 2019-04-03: qty 2

## 2019-04-03 MED ORDER — POTASSIUM CHLORIDE CRYS ER 20 MEQ PO TBCR
40.0000 meq | EXTENDED_RELEASE_TABLET | Freq: Once | ORAL | Status: AC
  Administered 2019-04-03: 40 meq via ORAL
  Filled 2019-04-03: qty 2

## 2019-04-03 MED ORDER — TRAMADOL HCL 50 MG PO TABS: 50.0000 mg | ORAL_TABLET | Freq: Three times a day (TID) | ORAL | 0 refills | Status: DC | PRN

## 2019-04-03 MED ORDER — DILTIAZEM LOAD VIA INFUSION
20.0000 mg | Freq: Once | INTRAVENOUS | Status: AC
  Administered 2019-04-03: 20 mg via INTRAVENOUS
  Filled 2019-04-03: qty 20

## 2019-04-03 MED ORDER — DILTIAZEM HCL-DEXTROSE 125-5 MG/125ML-% IV SOLN (PREMIX)
5.0000 mg/h | INTRAVENOUS | Status: DC
  Administered 2019-04-03: 5 mg/h via INTRAVENOUS
  Filled 2019-04-03: qty 125

## 2019-04-03 MED ORDER — SODIUM CHLORIDE 0.9 % IV BOLUS
1000.0000 mL | Freq: Once | INTRAVENOUS | Status: AC
  Administered 2019-04-03: 1000 mL via INTRAVENOUS

## 2019-04-03 MED ORDER — MORPHINE SULFATE (PF) 4 MG/ML IV SOLN
4.0000 mg | Freq: Once | INTRAVENOUS | Status: AC
  Administered 2019-04-03: 4 mg via INTRAVENOUS
  Filled 2019-04-03: qty 1

## 2019-04-03 MED ORDER — ACETAMINOPHEN 325 MG PO TABS
650.0000 mg | ORAL_TABLET | Freq: Once | ORAL | Status: AC
  Administered 2019-04-03: 650 mg via ORAL
  Filled 2019-04-03: qty 2

## 2019-04-03 NOTE — Discharge Instructions (Addendum)
It was our pleasure to provide your ER care today - we hope that you feel better.  For shoulder, take acetaminophen as need for pain. If pain not controlled by acetaminophen, you may also try ultram as need for pain.   From today's labs, your potassium level is mildly low (3.2) - eat plenty of fruits and vegetables, take one extra of your potassium supplement each day for the next 3 days, and follow up with primary care doctor.   Follow up with primary care doctor in the coming week.  Return to ER if worse, new symptoms, fevers, chest pain, trouble breathing, or other concern.

## 2019-04-03 NOTE — ED Notes (Signed)
Cardizem stopped, heart rate= 70-80s, remains a fib, BP 94/60

## 2019-04-03 NOTE — ED Triage Notes (Signed)
To ED via [facility] from [facility] in [place] from assisted living area--  Pt c/o left shoulder pain with palpation- has a port a cath in left chest area for colon cancer.  A/O x 4, denies any chest pain, hx of AFib, currently in Afib -RVR rate of 130-150,

## 2019-04-03 NOTE — ED Notes (Signed)
Pt assisted with female urinal  

## 2019-04-03 NOTE — ED Notes (Signed)
ptar here to transport pt back to nursing home

## 2019-04-03 NOTE — ED Notes (Signed)
The daughter has spoken to me and she knows about her mother  Going back to [facility]  Dr Ashok Cordia has spoken to the daughter also.  ptar called to transport the pt back to the assisted living   Report called back to the nursing home   Report given to evelyn at the nursing home

## 2019-04-03 NOTE — ED Notes (Addendum)
Pt resting at present ?

## 2019-04-03 NOTE — ED Notes (Signed)
Pt does meet ALONE criteria-If daughter comes, please send her to pt's rm.

## 2019-04-03 NOTE — ED Notes (Signed)
Daughter- makynlee quinde [phone] would like updates and whenever pt is discharged or admitted

## 2019-04-03 NOTE — ED Provider Notes (Signed)
[facility] EMERGENCY DEPARTMENT Provider Note   CSN: [id number] Arrival date & time: 04/03/19  1033     History Chief Complaint  Patient presents with  . Atrial Fibrillation  . Shoulder Pain    Rebecca Hodges is a 86 y.o. female.  Patient from [facility] via EMS, hx afib, c/o left shoulder pain acute onset this AM at rest. Symptoms acute onset, mod-severe, constant, worse w movement left shoulder or palpation area. Patient denies hx chronic shoulder pain. +hx arthritis. Denies shoulder injury, strain or fall. No radicular pain or arm swelling, numbness or weakness. No redness to area. No fever or chills. EMS also noted patient was in afib, hx afib in past, and is on anticoag therapy. Patient denies chest pain or discomfort. No sob. No abd pain or nv. No leg pain or swelling.   The history is provided by the patient and the EMS personnel.  Atrial Fibrillation Pertinent negatives include no chest pain, no abdominal pain, no headaches and no shortness of breath.  Shoulder Pain Associated symptoms: no back pain, no fever and no neck pain        Past Medical History:  Diagnosis Date  . Acute hyponatremia 04/2016  . Colon cancer ([place]) 10/2012   Stage II a PT 3 PN 0M0 invasive moderately differentiated adenocarcinoma of the ascending colon  . Hypertension   . Hypokalemia 04/2016  . Lymphoma ([place])   . Osteoporosis   . Rheumatoid arthritis ([place])   . Waldenstrom macroglobulinemia [facility])     Patient Active Problem List   Diagnosis Date Noted  . Intractable abdominal pain   . Fecal impaction in rectum ([place])   . Hyponatremia 05/14/2016  . Acute hypokalemia   . Abdominal pain 05/13/2016  . Acute hyponatremia 05/13/2016  . Lymphoma ([place]) 05/13/2016  . CAP (community acquired pneumonia) 05/17/2013  . HCAP (healthcare-associated pneumonia) 05/17/2013  . Colon cancer ([place]) 05/17/2013  . Rheumatoid arthritis ([place]) 05/17/2013  . HTN (hypertension) 05/17/2013  .  HLD (hyperlipidemia) 05/17/2013    Past Surgical History:  Procedure Laterality Date  . ABDOMINAL HYSTERECTOMY    . APPENDECTOMY    . PARTIAL COLECTOMY     2014 a sending partial colectomy     OB History   No obstetric history on file.     Family History  Problem Relation Age of Onset  . Arthritis/Rheumatoid Mother     Social History   Tobacco Use  . Smoking status: Former Research scientist (life sciences)  . Smokeless tobacco: Never Used  Substance Use Topics  . Alcohol use: No  . Drug use: No    Home Medications Prior to Admission medications   Medication Sig Start Date End Date Taking? Authorizing Provider  alendronate (FOSAMAX) 70 MG tablet Take 70 mg by mouth once a week. Take with a full glass of water on an empty stomach.    [provider]  aspirin 81 MG tablet Take 81 mg by mouth daily.    [provider]  calcium carbonate (OSCAL) 1500 (600 Ca) MG TABS tablet Take 1,500 mg by mouth 2 (two) times daily with a meal.    [provider]  clobetasol cream (TEMOVATE) [id number] % Apply 1 application topically 2 (two) times daily.    [provider]  diltiazem (CARDIZEM CD) 240 MG 24 hr capsule Take 240 mg by mouth daily.    [provider]  folic acid (FOLVITE) 1 MG tablet Take 1 mg by mouth daily.    [provider]  methotrexate 2.5 MG tablet Take 15 mg by mouth once a week.    [provider]  metoprolol succinate (TOPROL-XL) 25 MG 24 hr tablet Take 75 mg by mouth daily.    [provider]  potassium chloride (K-DUR) 10 MEQ tablet Take 2 tablets (20 mEq total) by mouth daily. 05/15/16   Rosita Fire, MD  potassium chloride SA (K-DUR,KLOR-CON) 20 MEQ tablet Take 20 mEq by mouth 2 (two) times daily.    [provider]  predniSONE (DELTASONE) 5 MG tablet Take 7.5 mg by mouth daily with breakfast.     [provider]  simvastatin (ZOCOR) 10 MG tablet Take 10 mg by mouth daily.    [provider]      Allergies    Penicillins  Review of Systems   Review of Systems  Constitutional: Negative for fever.  HENT: Negative for sore throat.   Eyes: Negative for redness.  Respiratory: Negative for cough and shortness of breath.   Cardiovascular: Negative for chest pain.  Gastrointestinal: Negative for abdominal pain and vomiting.  Genitourinary: Negative for flank pain.  Musculoskeletal: Negative for back pain and neck pain.  Skin: Negative for rash.  Neurological: Negative for headaches.  Hematological:       +anticoag therapy, no recent abnormal bruising or bleeding.   Psychiatric/Behavioral: Negative for confusion.    Physical Exam Updated Vital Signs BP (!) 126/92 (BP Location: Right Arm)   Pulse (!) 132   Temp 98.7 F (37.1 C) (Oral)   Resp 16   Ht 1.702 m (5\' 7" )   Wt 59 kg   SpO2 100%   BMI 20.36 kg/m   Physical Exam Vitals and nursing note reviewed.  Constitutional:      Appearance: Normal appearance. She is well-developed.  HENT:     Head: Atraumatic.     Nose: Nose normal.     Mouth/Throat:     Mouth: Mucous membranes are moist.  Eyes:     General: No scleral icterus.    Conjunctiva/sclera: Conjunctivae normal.     Pupils: Pupils are equal, round, and reactive to light.  Neck:     Trachea: No tracheal deviation.  Cardiovascular:     Rate and Rhythm: Tachycardia present. Rhythm irregular.     Pulses: Normal pulses.     Heart sounds: Normal heart sounds. No murmur. No friction rub. No gallop.   Pulmonary:     Effort: Pulmonary effort is normal. No respiratory distress.     Breath sounds: Normal breath sounds.  Abdominal:     General: Bowel sounds are normal. There is no distension.     Palpations: Abdomen is soft.     Tenderness: There is no abdominal tenderness. There is no guarding.  Genitourinary:    Comments: No cva tenderness.  Musculoskeletal:        General: No swelling.     Cervical back: Normal range of motion and neck supple. No  rigidity. No muscular tenderness.     Comments: +tenderness left shoulder and pain w rom, reproducing pts shoulder pain symptoms. No redness or increased warmth to area. Radial pulse 2+. No swelling to arm.   Skin:    General: Skin is warm and dry.     Findings: No rash.  Neurological:     Mental Status: She is alert.     Comments: Alert, speech normal. LUE motor/sens grossly intact.   Psychiatric:        Mood and Affect:  Mood normal.     ED Results / Procedures / Treatments   Labs (all labs ordered are listed, but only abnormal results are displayed) Results for orders placed or performed during the hospital encounter of 04/03/19  Respiratory Panel by RT PCR (Flu A&B, Covid) - Nasopharyngeal Swab   Specimen: Nasopharyngeal Swab  Result Value Ref Range   SARS Coronavirus 2 by RT PCR NEGATIVE NEGATIVE   Influenza A by PCR NEGATIVE NEGATIVE   Influenza B by PCR NEGATIVE NEGATIVE  CBC  Result Value Ref Range   WBC 13.9 (H) 4.0 - 10.5 K/uL   RBC 4.42 3.87 - 5.11 MIL/uL   Hemoglobin 13.6 12.0 - 15.0 g/dL   HCT 41.9 36.0 - 46.0 %   MCV 94.8 80.0 - 100.0 fL   MCH 30.8 26.0 - 34.0 pg   MCHC 32.5 30.0 - 36.0 g/dL   RDW 15.1 11.5 - 15.5 %   Platelets 345 150 - 400 K/uL   nRBC 0.0 0.0 - 0.2 %  Comprehensive metabolic panel  Result Value Ref Range   Sodium 137 135 - 145 mmol/L   Potassium 3.2 (L) 3.5 - 5.1 mmol/L   Chloride 101 98 - 111 mmol/L   CO2 23 22 - 32 mmol/L   Glucose, Bld 89 70 - 99 mg/dL   BUN 22 8 - 23 mg/dL   Creatinine, Ser 0.91 0.44 - 1.00 mg/dL   Calcium 8.9 8.9 - 10.3 mg/dL   Total Protein 5.9 (L) 6.5 - 8.1 g/dL   Albumin 3.4 (L) 3.5 - 5.0 g/dL   AST 21 15 - 41 U/L   ALT 17 0 - 44 U/L   Alkaline Phosphatase 110 38 - 126 U/L   Total Bilirubin 0.8 0.3 - 1.2 mg/dL   GFR calc non Af Amer 57 (L) >60 mL/min   GFR calc Af Amer >60 >60 mL/min   Anion gap 13 5 - 15  Troponin I (High Sensitivity)  Result Value Ref Range   Troponin I (High Sensitivity) 14 <18 ng/L     EKG EKG Interpretation  Date/Time:  Sunday April 03 2019 10:36:47 EST Ventricular Rate:  114 PR Interval:    QRS Duration: 146 QT Interval:  359 QTC Calculation: 495 R Axis:   116 Text Interpretation: Atrial fibrillation Right bundle branch block Non-specific ST-t changes Confirmed by Lajean Saver [phone]) on 04/03/2019 11:05:55 AM   Radiology No results found.  Procedures Procedures (including critical care time)  Medications Ordered in ED Medications  diltiazem (CARDIZEM) 1 mg/mL load via infusion 20 mg (has no administration in time range)    And  diltiazem (CARDIZEM) 125 mg in dextrose 5% 125 mL (1 mg/mL) infusion (has no administration in time range)    ED Course  I have reviewed the triage vital signs and the nursing notes.  Pertinent labs & imaging results that were available during my care of the patient were reviewed by me and considered in my medical decision making (see chart for details).    MDM Rules/Calculators/A&P                      Iv ns. Morphine iv for pain.   Reviewed nursing notes and prior charts for additional history.   Labs sent. Imaging ordered.   CHA2DS2VASC score  = 4.  Labs reviewed/interpreted by me - chem normal. Initial trop normal.  Hgb normal, wbc decreased from prior.   CXR reviewed/interpreted by me - no pna.  Additional xrays reviewed/interepreted by me - degen changes left shoulder.  Additional labs reviewed/interpreted by me - k mildly low. kcl po.   Recheck pt, shoulder pain improved. Recurs w palpation and rom.  No chest pain or sob.   Heart rate improved.   Po fluids. Food.   Recheck, no cp. Hr 88, rr 14. Shoulder pain improved. Delta trop pending.   Patient requests d/c back to ecf, states feels much better. Shoulder pain improved. No cp or discomfort. No sob.  Daughter contacted at pt request, discussed ed workup and f/u plan.         Final Clinical Impression(s) / ED Diagnoses Final diagnoses:   None    Rx / DC Orders ED Discharge Orders    None       Lajean Saver, MD 04/04/19 [phone]

## 2019-04-03 NOTE — ED Notes (Addendum)
Per daughter - pt fell in December, had xrays in [place]- had fx "low back "  Now uses a new kind of walker-- daughter thinks that she is having shoulder pain due to that- pt just started walking in the past 2 weeks.   Pt has Charity fundraiser at [place] that has been helping her walk.

## 2020-02-15 ENCOUNTER — Emergency Department (HOSPITAL_COMMUNITY)
Admission: EM | Admit: 2020-02-15 | Discharge: 2020-02-16 | Disposition: A | Discharge status: Home / Self Care | Payer: Medicare Other | Attending: Emergency Medicine | Admitting: Emergency Medicine

## 2020-02-15 DIAGNOSIS — Z87891 Personal history of nicotine dependence: Secondary | ICD-10-CM | POA: Insufficient documentation

## 2020-02-15 DIAGNOSIS — K649 Unspecified hemorrhoids: Secondary | ICD-10-CM | POA: Diagnosis not present

## 2020-02-15 DIAGNOSIS — Z85038 Personal history of other malignant neoplasm of large intestine: Secondary | ICD-10-CM | POA: Insufficient documentation

## 2020-02-15 DIAGNOSIS — Z79899 Other long term (current) drug therapy: Secondary | ICD-10-CM | POA: Insufficient documentation

## 2020-02-15 DIAGNOSIS — K625 Hemorrhage of anus and rectum: Secondary | ICD-10-CM | POA: Diagnosis not present

## 2020-02-15 DIAGNOSIS — I10 Essential (primary) hypertension: Secondary | ICD-10-CM | POA: Insufficient documentation

## 2020-02-15 DIAGNOSIS — Z7901 Long term (current) use of anticoagulants: Secondary | ICD-10-CM | POA: Insufficient documentation

## 2020-02-15 LAB — COMPREHENSIVE METABOLIC PANEL
ALT: 17 U/L (ref 0–44)
AST: 21 U/L (ref 15–41)
Albumin: 3.3 g/dL — ABNORMAL LOW (ref 3.5–5.0)
Alkaline Phosphatase: 44 U/L (ref 38–126)
Anion gap: 9 (ref 5–15)
BUN: 28 mg/dL — ABNORMAL HIGH (ref 8–23)
CO2: 26 mmol/L (ref 22–32)
Calcium: 9.3 mg/dL (ref 8.9–10.3)
Chloride: 99 mmol/L (ref 98–111)
Creatinine, Ser: 1.19 mg/dL — ABNORMAL HIGH (ref 0.44–1.00)
GFR, Estimated: 44 mL/min — ABNORMAL LOW (ref >60)
Glucose, Bld: 107 mg/dL — ABNORMAL HIGH (ref 70–99)
Potassium: 3.8 mmol/L (ref 3.5–5.1)
Sodium: 134 mmol/L — ABNORMAL LOW (ref 135–145)
Total Bilirubin: 0.6 mg/dL (ref 0.3–1.2)
Total Protein: 5.6 g/dL — ABNORMAL LOW (ref 6.5–8.1)

## 2020-02-15 LAB — TYPE AND SCREEN
ABO/RH(D): O POS
Antibody Screen: NEGATIVE

## 2020-02-15 LAB — CBC
HCT: 33.3 % — ABNORMAL LOW (ref 36.0–46.0)
Hemoglobin: 10.4 g/dL — ABNORMAL LOW (ref 12.0–15.0)
MCH: 29 pg (ref 26.0–34.0)
MCHC: 31.2 g/dL (ref 30.0–36.0)
MCV: 92.8 fL (ref 80.0–100.0)
Platelets: 309 10*3/uL (ref 150–400)
RBC: 3.59 MIL/uL — ABNORMAL LOW (ref 3.87–5.11)
RDW: 17.5 % — ABNORMAL HIGH (ref 11.5–15.5)
WBC: 10.4 10*3/uL (ref 4.0–10.5)
nRBC: 0 % (ref 0.0–0.2)

## 2020-02-15 NOTE — ED Triage Notes (Signed)
Pt arrives via EMS from [place], began having rectal bleeding this morning. Has hx of hemorrhoids. Pt taking xarelto. Pt awake, alert, appropriate. NAD at present. VSS.

## 2020-02-16 DIAGNOSIS — K625 Hemorrhage of anus and rectum: Secondary | ICD-10-CM | POA: Diagnosis not present

## 2020-02-16 LAB — HEMOGLOBIN AND HEMATOCRIT, BLOOD
HCT: 33.8 % — ABNORMAL LOW (ref 36.0–46.0)
Hemoglobin: 10.6 g/dL — ABNORMAL LOW (ref 12.0–15.0)

## 2020-02-16 LAB — ABO/RH: ABO/RH(D): O POS

## 2020-02-16 MED ORDER — ACETAMINOPHEN 325 MG PO TABS
650.0000 mg | ORAL_TABLET | Freq: Once | ORAL | Status: AC
  Administered 2020-02-16: 650 mg via ORAL
  Filled 2020-02-16: qty 2

## 2020-02-16 NOTE — ED Provider Notes (Signed)
[facility] EMERGENCY DEPARTMENT Provider Note   CSN: [id number] Arrival date & time: 02/15/20  1608     History Chief Complaint  Patient presents with  . Rectal Bleeding    Rebecca Hodges is a 86 y.o. female.  HPI 86 year old female presents with rectal bleeding. She has been having rectal bleeding for 8 months or so. She lives at [facility], and the nurse felt like the bleeding was more yesterday. The patient is on xarelto. Has a history of colon cancer. She denies abdominal pain. Has a history of hemorrhoids but these are not painful at this time. No vomiting/hematemesis. No dizziness/lightheadedness. She has not had any bleeding since arriving to the ER about 18 hours ago.    Past Medical History:  Diagnosis Date  . Acute hyponatremia 04/2016  . Colon cancer ([facility]) 10/2012   Stage II a PT 3 PN 0M0 invasive moderately differentiated adenocarcinoma of the ascending colon  . Hypertension   . Hypokalemia 04/2016  . Lymphoma ([facility])   . Osteoporosis   . Rheumatoid arthritis ([facility])   . Waldenstrom macroglobulinemia [facility])     Patient Active Problem List   Diagnosis Date Noted  . Intractable abdominal pain   . Fecal impaction in rectum ([facility])   . Hyponatremia 05/14/2016  . Acute hypokalemia   . Abdominal pain 05/13/2016  . Acute hyponatremia 05/13/2016  . Lymphoma ([facility]) 05/13/2016  . CAP (community acquired pneumonia) 05/17/2013  . HCAP (healthcare-associated pneumonia) 05/17/2013  . Colon cancer ([facility]) 05/17/2013  . Rheumatoid arthritis ([facility]) 05/17/2013  . HTN (hypertension) 05/17/2013  . HLD (hyperlipidemia) 05/17/2013    Past Surgical History:  Procedure Laterality Date  . ABDOMINAL HYSTERECTOMY    . APPENDECTOMY    . PARTIAL COLECTOMY     2014 a sending partial colectomy     OB History   No obstetric history on file.     Family History  Problem Relation Age of Onset  . Arthritis/Rheumatoid Mother     Social History   Tobacco Use   . Smoking status: Former Games developer  . Smokeless tobacco: Never Used  Substance Use Topics  . Alcohol use: No  . Drug use: No    Home Medications Prior to Admission medications   Medication Sig Start Date End Date Taking? Authorizing Provider  calcium carbonate (OS-CAL) 600 MG TABS tablet Take 600 mg by mouth 2 (two) times daily with a meal.     [provider]  clobetasol cream (TEMOVATE) 0.05 % Apply 1 application topically 2 (two) times daily as needed (rash).     [provider]  diltiazem (CARDIZEM CD) 240 MG 24 hr capsule Take 240 mg by mouth daily.    [provider]  docusate sodium (COLACE) 100 MG capsule Take 100 mg by mouth daily.    [provider]  folic acid (FOLVITE) 1 MG tablet Take 1 mg by mouth daily.    [provider]  hydrochlorothiazide (HYDRODIURIL) 25 MG tablet Take 25 mg by mouth daily. 03/03/19   [provider]  letrozole (FEMARA) 2.5 MG tablet Take 2.5 mg by mouth daily. 03/24/19   [provider]  losartan (COZAAR) 100 MG tablet Take 100 mg by mouth daily. 03/26/19   [provider]  Menthol (ICY HOT) 5 % PTCH Apply 1 patch topically daily as needed (back pain).    [provider]  methotrexate 2.5 MG tablet Take 15 mg by mouth once a week. Monday  [provider]  metoprolol succinate (TOPROL-XL) 100 MG 24 hr tablet Take 100 mg by mouth daily.     [provider]  [facility] powder Apply 1 application topically 2 (two) times daily. To buttocks 03/01/19   [provider]  potassium chloride (K-DUR) 10 MEQ tablet Take 2 tablets (20 mEq total) by mouth daily. Patient not taking: Reported on 04/03/2019 05/15/16   Maxie Barb, MD  Potassium Gluconate 550 MG TABS Take 550 mg by mouth daily. 06/15/18 06/15/19  [provider]  predniSONE (DELTASONE) 5 MG tablet Take 5 mg by mouth daily with breakfast.     [provider]  simvastatin (ZOCOR) 10 MG  tablet Take 10 mg by mouth at bedtime.     [provider]  traMADol (ULTRAM) 50 MG tablet Take 1 tablet (50 mg total) by mouth every 8 (eight) hours as needed. 04/03/19   Cathren Laine, MD  XARELTO 20 MG TABS tablet Take 20 mg by mouth daily. 03/03/19   [provider]    Allergies    Penicillins  Review of Systems   Review of Systems  Gastrointestinal: Positive for anal bleeding. Negative for abdominal pain, rectal pain and vomiting.  Neurological: Negative for dizziness and light-headedness.  All other systems reviewed and are negative.   Physical Exam Updated Vital Signs BP (!) 142/88   Pulse (!) 101   Temp 97.8 F (36.6 C)   Resp 16   SpO2 100%   Physical Exam Vitals and nursing note reviewed. Exam conducted with a chaperone present.  Constitutional:      Appearance: She is well-developed and well-nourished.  HENT:     Head: Normocephalic and atraumatic.     Right Ear: External ear normal.     Left Ear: External ear normal.     Nose: Nose normal.  Eyes:     General:        Right eye: No discharge.        Left eye: No discharge.  Cardiovascular:     Rate and Rhythm: Normal rate. Rhythm irregular.     Heart sounds: Normal heart sounds.  Pulmonary:     Effort: Pulmonary effort is normal.     Breath sounds: Normal breath sounds.  Abdominal:     General: There is no distension.     Palpations: Abdomen is soft.     Tenderness: There is no abdominal tenderness.  Genitourinary:    Rectum: External hemorrhoid present. No tenderness.     Comments: No gross blood or melena on DRE. Has sizable hemorrhoids but no thrombosis or tenderness Skin:    General: Skin is warm and dry.  Neurological:     Mental Status: She is alert.  Psychiatric:        Mood and Affect: Mood is not anxious.     ED Results / Procedures / Treatments   Labs (all labs ordered are listed, but only abnormal results are displayed) Labs Reviewed  COMPREHENSIVE METABOLIC PANEL -  Abnormal; Notable for the following components:      Result Value   Sodium 134 (*)    Glucose, Bld 107 (*)    BUN 28 (*)    Creatinine, Ser 1.19 (*)    Total Protein 5.6 (*)    Albumin 3.3 (*)    GFR, Estimated 44 (*)    All other components within normal limits  CBC - Abnormal; Notable for the following components:   RBC 3.59 (*)  Hemoglobin 10.4 (*)    HCT 33.3 (*)    RDW 17.5 (*)    All other components within normal limits  HEMOGLOBIN AND HEMATOCRIT, BLOOD - Abnormal; Notable for the following components:   Hemoglobin 10.6 (*)    HCT 33.8 (*)    All other components within normal limits  POC OCCULT BLOOD, ED  TYPE AND SCREEN  ABO/RH    EKG None  Radiology No results found.  Procedures Procedures (including critical care time)  Medications Ordered in ED Medications  acetaminophen (TYLENOL) tablet 650 mg (has no administration in time range)    ED Course  I have reviewed the triage vital signs and the nursing notes.  Pertinent labs & imaging results that were available during my care of the patient were reviewed by me and considered in my medical decision making (see chart for details).    MDM Rules/Calculators/A&P                          Patient is well appearing. While she is on xarelto, her hemoglobin has remained stable on multiple H/H checks 10 hours apart (10.4 to 10.6). No further bleeding. This appears to be a chronic problem, either from hemorrhoids or her known colon cancer. No abdominal pain or tenderness, highly doubt mesenteric or colonic ischemia, or acute emergent abdominal pathology. Chart review shows that on 12/15 her hemoglobin was 10.9. thus with no current bleeding I think she is stable for d/c back to NH and follow up with GI, oncology and PCP. Return precautions given. Final Clinical Impression(s) / ED Diagnoses Final diagnoses:  Rectal bleeding  Hemorrhoids, unspecified hemorrhoid type    Rx / DC Orders ED Discharge Orders    None        Sherwood Gambler, MD 02/16/20 1050

## 2020-02-16 NOTE — Discharge Instructions (Signed)
If you develop severe rectal bleeding, dizziness, lightheadedness, vomiting blood, or any other new/concerning symptoms, then return to the ER for evaluation

## 2020-02-16 NOTE — ED Notes (Signed)
Pt to ED 24

## 2020-02-16 NOTE — ED Notes (Signed)
[facility] called by Jonette Wassel at 12:56.

## 2020-02-29 ENCOUNTER — Telehealth: Payer: Self-pay | Admitting: Gastroenterology

## 2020-02-29 NOTE — Telephone Encounter (Signed)
I tried contacting that particular number and they told me that that was a transitional care clinic.  The patient actually is not even a patient of that particular transitional care.  I subsequently was given a number of [phone] to call to try and see if I could talk with the patient's provider Dr. Antony Salmon.  He was not available.  They sent me to his nurse who would try to give information and he/she was not available.  I left a voicemail. Reading through the chart it looks like this is a patient who presented to the emergency department with rectal bleeding and is being referred potentially for rectal bleeding.  She is of advanced age and has a history of colon cancer status postresection.  She has a gastroenterologist previously in the [facility] network Dr. Marin Comment.  The patient is in an assisted living from what I can gather. If the patient wants to establish with a new gastroenterologist and that is what the referral is about in regards to further evaluation we are happy to see her.  She can be seen by myself or one of the APP's or by any of the gastroenterologist who have availability. Okay to double book with me if necessary as a result of the rectal bleeding.  However, as a result of her advanced age she will need a family member to be with her for [place] to decide whether endoscopic evaluation may be required.  From reading through the notes, it seems like she had deferred with oncology to have any further colonoscopies due to her advanced age. Patty, I left a voicemail for them to try and call and ask [place] about what exactly they were trying to have [place] help with but if you could get some further information if they call back then please move forward with that otherwise you can work on trying to schedule a clinic visit and we can see as able. Please let Dr. Carmin Muskrat office know that we are trying to work with them if you do not hear a call back. Thank you. GM

## 2020-02-29 NOTE — Telephone Encounter (Signed)
Left message on facility nurse [place] phone to call back.

## 2020-02-29 NOTE — Telephone Encounter (Signed)
Hi Dr. Rush Landmark, we received a referral from [facility] hospital for patient to be evaluated for rectal bleeding. Pt lives at an assisted living facility. I spoke with the nurse at the facility who provided me with information about the physician who manages this pt care over there. His name is Dr. Merilynn Finland and his contact number is [phone]. Thank you.

## 2020-03-01 ENCOUNTER — Encounter: Payer: Self-pay | Admitting: Gastroenterology

## 2020-03-01 NOTE — Telephone Encounter (Signed)
Consult scheduled on 03/27/20 at 9:30am.

## 2020-03-27 ENCOUNTER — Ambulatory Visit: Payer: Medicare Other | Admitting: Gastroenterology

## 2020-04-11 ENCOUNTER — Inpatient Hospital Stay (HOSPITAL_COMMUNITY)
Admission: EM | Admit: 2020-04-11 | Discharge: 2020-04-18 | DRG: 549 | Disposition: A | Discharge status: Skilled Nursing Facility | Payer: Medicare Other | Source: Skilled Nursing Facility | Attending: Student in an Organized Health Care Education/Training Program | Admitting: Student in an Organized Health Care Education/Training Program

## 2020-04-11 ENCOUNTER — Encounter (HOSPITAL_COMMUNITY): Payer: Self-pay | Admitting: Emergency Medicine

## 2020-04-11 ENCOUNTER — Emergency Department (HOSPITAL_COMMUNITY): Payer: Medicare Other

## 2020-04-11 ENCOUNTER — Inpatient Hospital Stay (HOSPITAL_COMMUNITY): Payer: Medicare Other

## 2020-04-11 DIAGNOSIS — I10 Essential (primary) hypertension: Secondary | ICD-10-CM | POA: Diagnosis present

## 2020-04-11 DIAGNOSIS — Z881 Allergy status to other antibiotic agents status: Secondary | ICD-10-CM

## 2020-04-11 DIAGNOSIS — M25532 Pain in left wrist: Secondary | ICD-10-CM | POA: Diagnosis present

## 2020-04-11 DIAGNOSIS — I714 Abdominal aortic aneurysm, without rupture: Secondary | ICD-10-CM | POA: Diagnosis present

## 2020-04-11 DIAGNOSIS — H1131 Conjunctival hemorrhage, right eye: Secondary | ICD-10-CM | POA: Diagnosis not present

## 2020-04-11 DIAGNOSIS — Z79899 Other long term (current) drug therapy: Secondary | ICD-10-CM

## 2020-04-11 DIAGNOSIS — Z87891 Personal history of nicotine dependence: Secondary | ICD-10-CM | POA: Diagnosis not present

## 2020-04-11 DIAGNOSIS — I4510 Unspecified right bundle-branch block: Secondary | ICD-10-CM | POA: Diagnosis present

## 2020-04-11 DIAGNOSIS — C8590 Non-Hodgkin lymphoma, unspecified, unspecified site: Secondary | ICD-10-CM | POA: Diagnosis present

## 2020-04-11 DIAGNOSIS — Z7901 Long term (current) use of anticoagulants: Secondary | ICD-10-CM | POA: Diagnosis not present

## 2020-04-11 DIAGNOSIS — E8779 Other fluid overload: Secondary | ICD-10-CM | POA: Diagnosis not present

## 2020-04-11 DIAGNOSIS — Z79811 Long term (current) use of aromatase inhibitors: Secondary | ICD-10-CM | POA: Diagnosis not present

## 2020-04-11 DIAGNOSIS — Z9011 Acquired absence of right breast and nipple: Secondary | ICD-10-CM

## 2020-04-11 DIAGNOSIS — M069 Rheumatoid arthritis, unspecified: Secondary | ICD-10-CM | POA: Diagnosis present

## 2020-04-11 DIAGNOSIS — Z681 Body mass index (BMI) 19 or less, adult: Secondary | ICD-10-CM | POA: Diagnosis not present

## 2020-04-11 DIAGNOSIS — Z20822 Contact with and (suspected) exposure to covid-19: Secondary | ICD-10-CM | POA: Diagnosis present

## 2020-04-11 DIAGNOSIS — T368X5A Adverse effect of other systemic antibiotics, initial encounter: Secondary | ICD-10-CM | POA: Diagnosis not present

## 2020-04-11 DIAGNOSIS — L298 Other pruritus: Secondary | ICD-10-CM | POA: Diagnosis not present

## 2020-04-11 DIAGNOSIS — Z85048 Personal history of other malignant neoplasm of rectum, rectosigmoid junction, and anus: Secondary | ICD-10-CM

## 2020-04-11 DIAGNOSIS — M009 Pyogenic arthritis, unspecified: Secondary | ICD-10-CM | POA: Diagnosis present

## 2020-04-11 DIAGNOSIS — M0600 Rheumatoid arthritis without rheumatoid factor, unspecified site: Secondary | ICD-10-CM | POA: Diagnosis not present

## 2020-04-11 DIAGNOSIS — F0390 Unspecified dementia without behavioral disturbance: Secondary | ICD-10-CM | POA: Diagnosis present

## 2020-04-11 DIAGNOSIS — Z66 Do not resuscitate: Secondary | ICD-10-CM | POA: Diagnosis present

## 2020-04-11 DIAGNOSIS — A419 Sepsis, unspecified organism: Secondary | ICD-10-CM | POA: Diagnosis not present

## 2020-04-11 DIAGNOSIS — R21 Rash and other nonspecific skin eruption: Secondary | ICD-10-CM | POA: Diagnosis not present

## 2020-04-11 DIAGNOSIS — M810 Age-related osteoporosis without current pathological fracture: Secondary | ICD-10-CM | POA: Diagnosis present

## 2020-04-11 DIAGNOSIS — Z888 Allergy status to other drugs, medicaments and biological substances status: Secondary | ICD-10-CM

## 2020-04-11 DIAGNOSIS — C182 Malignant neoplasm of ascending colon: Secondary | ICD-10-CM | POA: Diagnosis not present

## 2020-04-11 DIAGNOSIS — C189 Malignant neoplasm of colon, unspecified: Secondary | ICD-10-CM | POA: Diagnosis present

## 2020-04-11 DIAGNOSIS — I482 Chronic atrial fibrillation, unspecified: Secondary | ICD-10-CM | POA: Diagnosis present

## 2020-04-11 DIAGNOSIS — T361X5A Adverse effect of cephalosporins and other beta-lactam antibiotics, initial encounter: Secondary | ICD-10-CM | POA: Diagnosis not present

## 2020-04-11 DIAGNOSIS — R64 Cachexia: Secondary | ICD-10-CM | POA: Diagnosis present

## 2020-04-11 DIAGNOSIS — M05731 Rheumatoid arthritis with rheumatoid factor of right wrist without organ or systems involvement: Secondary | ICD-10-CM | POA: Diagnosis not present

## 2020-04-11 DIAGNOSIS — L270 Generalized skin eruption due to drugs and medicaments taken internally: Secondary | ICD-10-CM | POA: Diagnosis present

## 2020-04-11 DIAGNOSIS — M05732 Rheumatoid arthritis with rheumatoid factor of left wrist without organ or systems involvement: Secondary | ICD-10-CM | POA: Diagnosis not present

## 2020-04-11 DIAGNOSIS — E8770 Fluid overload, unspecified: Secondary | ICD-10-CM

## 2020-04-11 DIAGNOSIS — Z880 Allergy status to penicillin: Secondary | ICD-10-CM

## 2020-04-11 DIAGNOSIS — Z853 Personal history of malignant neoplasm of breast: Secondary | ICD-10-CM

## 2020-04-11 LAB — HEPATIC FUNCTION PANEL
ALT: 13 U/L (ref 0–44)
AST: 20 U/L (ref 15–41)
Albumin: 3.4 g/dL — ABNORMAL LOW (ref 3.5–5.0)
Alkaline Phosphatase: 60 U/L (ref 38–126)
Bilirubin, Direct: 0.2 mg/dL (ref 0.0–0.2)
Indirect Bilirubin: 0.9 mg/dL (ref 0.3–0.9)
Total Bilirubin: 1.1 mg/dL (ref 0.3–1.2)
Total Protein: 6.1 g/dL — ABNORMAL LOW (ref 6.5–8.1)

## 2020-04-11 LAB — LACTIC ACID, PLASMA
Lactic Acid, Venous: 1.2 mmol/L (ref 0.5–1.9)
Lactic Acid, Venous: 1.2 mmol/L (ref 0.5–1.9)

## 2020-04-11 LAB — BASIC METABOLIC PANEL
Anion gap: 12 (ref 5–15)
BUN: 20 mg/dL (ref 8–23)
CO2: 26 mmol/L (ref 22–32)
Calcium: 9.2 mg/dL (ref 8.9–10.3)
Chloride: 99 mmol/L (ref 98–111)
Creatinine, Ser: 1.13 mg/dL — ABNORMAL HIGH (ref 0.44–1.00)
GFR, Estimated: 47 mL/min — ABNORMAL LOW (ref >60)
Glucose, Bld: 118 mg/dL — ABNORMAL HIGH (ref 70–99)
Potassium: 3.1 mmol/L — ABNORMAL LOW (ref 3.5–5.1)
Sodium: 137 mmol/L (ref 135–145)

## 2020-04-11 LAB — CBC
HCT: 34.1 % — ABNORMAL LOW (ref 36.0–46.0)
Hemoglobin: 11.2 g/dL — ABNORMAL LOW (ref 12.0–15.0)
MCH: 30 pg (ref 26.0–34.0)
MCHC: 32.8 g/dL (ref 30.0–36.0)
MCV: 91.4 fL (ref 80.0–100.0)
Platelets: 479 10*3/uL — ABNORMAL HIGH (ref 150–400)
RBC: 3.73 MIL/uL — ABNORMAL LOW (ref 3.87–5.11)
RDW: 17.8 % — ABNORMAL HIGH (ref 11.5–15.5)
WBC: 11.4 10*3/uL — ABNORMAL HIGH (ref 4.0–10.5)
nRBC: 0 % (ref 0.0–0.2)

## 2020-04-11 LAB — TROPONIN I (HIGH SENSITIVITY)
Troponin I (High Sensitivity): 13 ng/L (ref <18)
Troponin I (High Sensitivity): 16 ng/L (ref <18)

## 2020-04-11 LAB — SEDIMENTATION RATE: Sed Rate: 35 mm/hr — ABNORMAL HIGH (ref 0–22)

## 2020-04-11 LAB — RESP PANEL BY RT-PCR (FLU A&B, COVID) ARPGX2
Influenza A by PCR: NEGATIVE
Influenza B by PCR: NEGATIVE
SARS Coronavirus 2 by RT PCR: NEGATIVE

## 2020-04-11 LAB — C-REACTIVE PROTEIN: CRP: 7.6 mg/dL — ABNORMAL HIGH (ref <1.0)

## 2020-04-11 MED ORDER — HYDROMORPHONE HCL 1 MG/ML IJ SOLN
0.5000 mg | Freq: Once | INTRAMUSCULAR | Status: AC
  Administered 2020-04-11: 0.5 mg via INTRAVENOUS
  Filled 2020-04-11: qty 1

## 2020-04-11 MED ORDER — METOPROLOL SUCCINATE ER 100 MG PO TB24
100.0000 mg | ORAL_TABLET | Freq: Every day | ORAL | Status: DC
  Administered 2020-04-11 – 2020-04-18 (×8): 100 mg via ORAL
  Filled 2020-04-11 (×8): qty 1

## 2020-04-11 MED ORDER — HYDROMORPHONE HCL 1 MG/ML IJ SOLN
0.5000 mg | INTRAMUSCULAR | Status: DC | PRN
  Administered 2020-04-12 – 2020-04-13 (×4): 0.5 mg via INTRAVENOUS
  Filled 2020-04-11 (×4): qty 1

## 2020-04-11 MED ORDER — DILTIAZEM HCL ER COATED BEADS 240 MG PO CP24: 240.0000 mg | ORAL_CAPSULE | Freq: Every day | ORAL | Status: DC

## 2020-04-11 MED ORDER — LACTATED RINGERS IV BOLUS (SEPSIS)
1000.0000 mL | Freq: Once | INTRAVENOUS | Status: AC
  Administered 2020-04-11: 1000 mL via INTRAVENOUS

## 2020-04-11 MED ORDER — POTASSIUM CHLORIDE CRYS ER 20 MEQ PO TBCR
40.0000 meq | EXTENDED_RELEASE_TABLET | Freq: Once | ORAL | Status: DC
  Filled 2020-04-11: qty 2

## 2020-04-11 MED ORDER — ACETAMINOPHEN 650 MG RE SUPP
650.0000 mg | Freq: Once | RECTAL | Status: AC
  Administered 2020-04-11: 650 mg via RECTAL
  Filled 2020-04-11: qty 1

## 2020-04-11 MED ORDER — VANCOMYCIN HCL 1250 MG/250ML IV SOLN
1250.0000 mg | INTRAVENOUS | Status: DC
  Administered 2020-04-13: 1250 mg via INTRAVENOUS
  Filled 2020-04-11 (×2): qty 250

## 2020-04-11 MED ORDER — METRONIDAZOLE IN NACL 5-0.79 MG/ML-% IV SOLN
500.0000 mg | Freq: Once | INTRAVENOUS | Status: AC
  Administered 2020-04-11: 500 mg via INTRAVENOUS
  Filled 2020-04-11: qty 100

## 2020-04-11 MED ORDER — OXYCODONE HCL 5 MG PO TABS
5.0000 mg | ORAL_TABLET | ORAL | Status: DC | PRN
  Administered 2020-04-11 – 2020-04-17 (×4): 5 mg via ORAL
  Filled 2020-04-11 (×5): qty 1

## 2020-04-11 MED ORDER — FOLIC ACID 1 MG PO TABS
1.0000 mg | ORAL_TABLET | Freq: Every day | ORAL | Status: DC
  Administered 2020-04-12 – 2020-04-18 (×7): 1 mg via ORAL
  Filled 2020-04-11 (×7): qty 1

## 2020-04-11 MED ORDER — LETROZOLE 2.5 MG PO TABS
2.5000 mg | ORAL_TABLET | Freq: Every day | ORAL | Status: DC
  Administered 2020-04-12 – 2020-04-18 (×7): 2.5 mg via ORAL
  Filled 2020-04-11 (×8): qty 1

## 2020-04-11 MED ORDER — ACETAMINOPHEN 325 MG PO TABS: 650.0000 mg | ORAL_TABLET | Freq: Four times a day (QID) | ORAL | Status: DC | PRN

## 2020-04-11 MED ORDER — VANCOMYCIN HCL 1250 MG/250ML IV SOLN
1250.0000 mg | Freq: Once | INTRAVENOUS | Status: AC
  Administered 2020-04-11: 1250 mg via INTRAVENOUS
  Filled 2020-04-11: qty 250

## 2020-04-11 MED ORDER — FENTANYL CITRATE (PF) 100 MCG/2ML IJ SOLN: 25.0000 ug | Freq: Once | INTRAMUSCULAR | Status: DC

## 2020-04-11 MED ORDER — SIMVASTATIN 20 MG PO TABS
10.0000 mg | ORAL_TABLET | Freq: Every day | ORAL | Status: DC
  Administered 2020-04-11 – 2020-04-17 (×7): 10 mg via ORAL
  Filled 2020-04-11 (×7): qty 1

## 2020-04-11 MED ORDER — POLYETHYLENE GLYCOL 3350 17 G PO PACK: 17.0000 g | PACK | Freq: Every day | ORAL | Status: DC | PRN

## 2020-04-11 MED ORDER — SENNA 8.6 MG PO TABS
1.0000 | ORAL_TABLET | Freq: Two times a day (BID) | ORAL | Status: DC
  Administered 2020-04-12 – 2020-04-18 (×12): 8.6 mg via ORAL
  Filled 2020-04-11 (×13): qty 1

## 2020-04-11 MED ORDER — SODIUM CHLORIDE 0.9 % IV SOLN: 2.0000 g | Freq: Once | INTRAVENOUS | Status: DC

## 2020-04-11 MED ORDER — ACETAMINOPHEN 650 MG RE SUPP
650.0000 mg | Freq: Four times a day (QID) | RECTAL | Status: DC | PRN
  Administered 2020-04-11: 650 mg via RECTAL

## 2020-04-11 MED ORDER — LACTATED RINGERS IV SOLN: INTRAVENOUS | Status: AC

## 2020-04-11 MED ORDER — SODIUM CHLORIDE 0.9 % IV SOLN
2.0000 g | Freq: Two times a day (BID) | INTRAVENOUS | Status: DC
  Administered 2020-04-11 – 2020-04-17 (×13): 2 g via INTRAVENOUS
  Filled 2020-04-11 (×13): qty 2

## 2020-04-11 MED ORDER — RIVAROXABAN 15 MG PO TABS
15.0000 mg | ORAL_TABLET | Freq: Every day | ORAL | Status: DC
  Administered 2020-04-11: 15 mg via ORAL
  Filled 2020-04-11 (×2): qty 1

## 2020-04-11 NOTE — ED Triage Notes (Addendum)
Patient BIB [facility] for evaluation of left arm pain. Facility stated that patient was initially complaining of left sided chest pain, then left shoulder pain, then left elbow pain, and now left wrist pain. Patient states she hurts "all over", uncooperative with questions to determine patient's level of orientation. When asked "Where are you hurting?" multiple times, patient responded each time with "Just put me away somewhere" and "please put me out". EMS also reports facility stated is more uncooperative than normal.

## 2020-04-11 NOTE — ED Provider Notes (Addendum)
[place] EMERGENCY DEPARTMENT Provider Note   CSN: [id number] Arrival date & time: 04/11/20  0930     History Chief Complaint  Patient presents with  . Arm Pain    Rebecca Hodges is a 87 y.o. female.  HPI   Patient is an 87 year old female with a history of RA, hypertension, colon cancer, lymphoma, who presents the emergency department due to chest pain.  Patient brought in by [facility].  Her facility states that she was initially complaining of left-sided chest pain which then began migrating into the left shoulder and then down the left arm.  Patient now is only complaining of pain in the left arm.  Appears to be in the left wrist and shoulder.  Patient not cooperative with questions and due to this it is difficult to obtain a thorough history.  She is DNR and has her paperwork at bedside.  I spoke to the patient's daughter.  She states that she resides in [facility].  She states over the last couple months she has had worsening confusion.  She spoke to her last night and states that she appeared to be at her baseline.  The nursing staff called her this morning regarding her left arm pain and when she spoke to her she denied any falls or recent trauma. She states neither the patient or staff at the nursing home noted any chest pain.     Past Medical History:  Diagnosis Date  . Acute hyponatremia 04/2016  . Colon cancer ([place]) 10/2012   Stage II a PT 3 PN 0M0 invasive moderately differentiated adenocarcinoma of the ascending colon  . Hypertension   . Hypokalemia 04/2016  . Lymphoma ([place])   . Osteoporosis   . Rheumatoid arthritis ([place])   . Waldenstrom macroglobulinemia [facility])     Patient Active Problem List   Diagnosis Date Noted  . Intractable abdominal pain   . Fecal impaction in rectum ([place])   . Hyponatremia 05/14/2016  . Acute hypokalemia   . Abdominal pain 05/13/2016  . Acute hyponatremia 05/13/2016  . Lymphoma ([place]) 05/13/2016  . CAP  (community acquired pneumonia) 05/17/2013  . HCAP (healthcare-associated pneumonia) 05/17/2013  . Colon cancer ([place]) 05/17/2013  . Rheumatoid arthritis ([place]) 05/17/2013  . HTN (hypertension) 05/17/2013  . HLD (hyperlipidemia) 05/17/2013    Past Surgical History:  Procedure Laterality Date  . ABDOMINAL HYSTERECTOMY    . APPENDECTOMY    . PARTIAL COLECTOMY     2014 a sending partial colectomy     OB History   No obstetric history on file.     Family History  Problem Relation Age of Onset  . Arthritis/Rheumatoid Mother     Social History   Tobacco Use  . Smoking status: Former Research scientist (life sciences)  . Smokeless tobacco: Never Used  Substance Use Topics  . Alcohol use: No  . Drug use: No    Home Medications Prior to Admission medications   Medication Sig Start Date End Date Taking? Authorizing Provider  calcium carbonate (OS-CAL) 600 MG TABS tablet Take 600 mg by mouth 2 (two) times daily with a meal.     [provider]  clobetasol cream (TEMOVATE) 8.31 % Apply 1 application topically 2 (two) times daily as needed (rash).     [provider]  diltiazem (CARDIZEM CD) 240 MG 24 hr capsule Take 240 mg by mouth daily.    [provider]  docusate sodium (COLACE) 100 MG capsule Take 100 mg by mouth daily.  [provider]  folic acid (FOLVITE) 1 MG tablet Take 1 mg by mouth daily.    [provider]  hydrochlorothiazide (HYDRODIURIL) 25 MG tablet Take 25 mg by mouth daily. 03/03/19   [provider]  letrozole (FEMARA) 2.5 MG tablet Take 2.5 mg by mouth daily. 03/24/19   [provider]  losartan (COZAAR) 100 MG tablet Take 100 mg by mouth daily. 03/26/19   [provider]  Menthol (ICY HOT) 5 % PTCH Apply 1 patch topically daily as needed (back pain).    [provider]  methotrexate 2.5 MG tablet Take 15 mg by mouth once a week. Monday    [provider]  metoprolol succinate (TOPROL-XL) 100 MG 24 hr  tablet Take 100 mg by mouth daily.     [provider]  [facility] powder Apply 1 application topically 2 (two) times daily. To buttocks 03/01/19   [provider]  potassium chloride (K-DUR) 10 MEQ tablet Take 2 tablets (20 mEq total) by mouth daily. Patient not taking: Reported on 04/03/2019 05/15/16   Rosita Fire, MD  Potassium Gluconate 550 MG TABS Take 550 mg by mouth daily. 06/15/18 06/15/19  [provider]  predniSONE (DELTASONE) 5 MG tablet Take 5 mg by mouth daily with breakfast.     [provider]  simvastatin (ZOCOR) 10 MG tablet Take 10 mg by mouth at bedtime.     [provider]  traMADol (ULTRAM) 50 MG tablet Take 1 tablet (50 mg total) by mouth every 8 (eight) hours as needed. 04/03/19   Lajean Saver, MD  XARELTO 20 MG TABS tablet Take 20 mg by mouth daily. 03/03/19   [provider]    Allergies    Ace inhibitors, Ciprofloxacin, and Penicillins  Review of Systems   Review of Systems  Unable to perform ROS: Other   Physical Exam Updated Vital Signs BP 140/87 (BP Location: Right Arm)   Pulse 95   Temp (!) 102.4 F (39.1 C) (Rectal)   Resp 13   Ht 5' 7" (1.702 m)   Wt 57 kg   SpO2 96%   BMI 19.68 kg/m   Physical Exam Vitals and nursing note reviewed.  Constitutional:      General: She is in acute distress.     Appearance: Normal appearance. She is not ill-appearing, toxic-appearing or diaphoretic.     Comments: Cachectic elderly female.  Sitting upright.  Speaks clearly and answers questions but at times behaves erratically and does not provide clear answers when asked.  HENT:     Head: Normocephalic and atraumatic.     Right Ear: External ear normal.     Left Ear: External ear normal.     Nose: Nose normal.     Mouth/Throat:     Mouth: Mucous membranes are moist.     Pharynx: Oropharynx is clear. No oropharyngeal exudate or posterior oropharyngeal erythema.  Eyes:     Extraocular Movements: Extraocular  movements intact.  Cardiovascular:     Rate and Rhythm: Tachycardia present. Rhythm irregular.     Pulses: Normal pulses.     Heart sounds: Normal heart sounds. No murmur heard. No friction rub. No gallop.   Pulmonary:     Effort: Pulmonary effort is normal. No respiratory distress.     Breath sounds: Normal breath sounds. No stridor. No wheezing, rhonchi or rales.  Abdominal:     General: Abdomen is flat.     Tenderness: There is no abdominal tenderness.  Musculoskeletal:        General: Swelling and tenderness present. Normal range of motion.     Cervical back: Normal range of motion and neck supple. No tenderness.     Right lower leg: No edema.     Left lower leg: No edema.     Comments: Left wrist is moderately tender circumferentially with associated erythema and increased warmth.  Additional diffuse tenderness noted in the left shoulder.  Unable to assess range of motion due to pain.  2+ radial pulses.  No leg swelling.  Palpable DP pulses.  Skin:    General: Skin is warm and dry.     Findings: Erythema present.  Neurological:     Mental Status: She is alert.     Comments: Oriented to person. States she "hurts all over".  Psychiatric:        Mood and Affect: Mood normal.        Behavior: Behavior normal.    ED Results / Procedures / Treatments   Labs (all labs ordered are listed, but only abnormal results are displayed) Labs Reviewed  BASIC METABOLIC PANEL - Abnormal; Notable for the following components:      Result Value   Potassium 3.1 (*)    Glucose, Bld 118 (*)    Creatinine, Ser 1.13 (*)    GFR, Estimated 47 (*)    All other components within normal limits  CBC - Abnormal; Notable for the following components:   WBC 11.4 (*)    RBC 3.73 (*)    Hemoglobin 11.2 (*)    HCT 34.1 (*)    RDW 17.8 (*)    Platelets 479 (*)    All other components within normal limits  HEPATIC FUNCTION PANEL - Abnormal; Notable for the following components:   Total Protein 6.1  (*)    Albumin 3.4 (*)    All other components within normal limits  C-REACTIVE PROTEIN - Abnormal; Notable for the following components:   CRP 7.6 (*)    All other components within normal limits  CULTURE, BLOOD (ROUTINE X 2)  CULTURE, BLOOD (ROUTINE X 2)  RESP PANEL BY RT-PCR (FLU A&B, COVID) ARPGX2  LACTIC ACID, PLASMA  LACTIC ACID, PLASMA  SEDIMENTATION RATE  TROPONIN I (HIGH SENSITIVITY)  TROPONIN I (HIGH SENSITIVITY)   EKG EKG Interpretation  Date/Time:  Wednesday April 11 2020 09:40:14 EST Ventricular Rate:  99 PR Interval:    QRS Duration: 146 QT Interval:  416 QTC Calculation: 533 R Axis:   98 Text Interpretation: Atrial fibrillation Right bundle branch block Abnormal ECG chronic RBBB, no change from previous Confirmed by Charlesetta Shanks [phone]) on 04/11/2020 2:57:24 PM   Radiology DG Chest 2 View  Result Date: 04/11/2020 CLINICAL DATA:  Chest pain. EXAM: CHEST - 2 VIEW COMPARISON:  Single-view of the chest 04/03/2019 and 09/21/2013. FINDINGS: The lungs are emphysematous. Subsegmental atelectasis is seen in the left base. Right lung is clear. No pneumothorax or pleural effusion. Cardiomegaly and aortic atherosclerosis. Port-A-Cath is in place. No acute or focal bony abnormality. IMPRESSION: Subsegmental atelectasis left lung base. Cardiomegaly. Aortic Atherosclerosis (ICD10-I70.0) and Emphysema (ICD10-J43.9). Electronically Signed   By: Inge Rise M.D.   On: 04/11/2020 11:27   DG Wrist Complete Left  Result Date: 04/11/2020 CLINICAL DATA:  Left wrist pain and swelling after a fall. EXAM: LEFT WRIST - COMPLETE 3+ VIEW COMPARISON:  None. FINDINGS: No acute fracture or dislocation. Chronically widened scapholunate interval with proximal capitate migration. Diffuse radiocarpal, midcarpal, and CMC  joint space narrowing with scattered erosions of the carpal bones, distal radius, and distal ulna. Increased mineralization within the distal radioulnar joint and along the  radial aspect of the radioscaphoid joint. Osteopenia. Diffuse soft tissue swelling about the wrist. IMPRESSION: 1. No acute osseous abnormality. 2. Sequelae of advanced rheumatoid arthritis.  SLAC wrist. Electronically Signed   By: Titus Dubin M.D.   On: 04/11/2020 11:47   DG Shoulder Left  Result Date: 04/11/2020 CLINICAL DATA:  Fall with left shoulder pain. EXAM: LEFT SHOULDER - 2+ VIEW COMPARISON:  None. FINDINGS: No acute fracture or dislocation identified. Bones are osteopenic. Mild degenerative disease of the [facility] joint. Portions of an indwelling Port-A-Cath are visualized. IMPRESSION: No acute fracture identified. Electronically Signed   By: Aletta Edouard M.D.   On: 04/11/2020 11:40   Procedures .Critical Care Performed by: Rayna Sexton, PA-C Authorized by: Rayna Sexton, PA-C   Critical care provider statement:    Critical care time (minutes):  45   Critical care was necessary to treat or prevent imminent or life-threatening deterioration of the following conditions:  Sepsis   Critical care was time spent personally by me on the following activities:  Discussions with consultants, evaluation of patient's response to treatment, examination of patient, ordering and performing treatments and interventions, ordering and review of laboratory studies, ordering and review of radiographic studies, pulse oximetry, re-evaluation of patient's condition, obtaining history from patient or surrogate and review of old charts    Medications Ordered in ED Medications  fentaNYL (SUBLIMAZE) injection 25 mcg (0 mcg Intravenous Hold 04/11/20 1309)  potassium chloride SA (KLOR-CON) CR tablet 40 mEq (0 mEq Oral Hold 04/11/20 1302)  lactated ringers infusion ( Intravenous New Bag/Given 04/11/20 1302)  vancomycin (VANCOREADY) IVPB 1250 mg/250 mL (1,250 mg Intravenous New Bag/Given 04/11/20 1401)  ceFEPIme (MAXIPIME) 2 g in sodium chloride 0.9 % 100 mL IVPB (0 g Intravenous Stopped 04/11/20 1258)   vancomycin (VANCOREADY) IVPB 1250 mg/250 mL (has no administration in time range)  lactated ringers bolus 1,000 mL (0 mLs Intravenous Stopped 04/11/20 1329)    And  lactated ringers bolus 1,000 mL (1,000 mLs Intravenous New Bag/Given 04/11/20 1330)  metroNIDAZOLE (FLAGYL) IVPB 500 mg (0 mg Intravenous Stopped 04/11/20 1358)  acetaminophen (TYLENOL) suppository 650 mg (650 mg Rectal Given 04/11/20 1309)   ED Course  I have reviewed the triage vital signs and the nursing notes.  Pertinent labs & imaging results that were available during my care of the patient were reviewed by me and considered in my medical decision making (see chart for details).  Clinical Course as of 04/11/20 1459  Wed Apr 11, 2020  1152 DG Wrist Complete Left IMPRESSION: 1. No acute osseous abnormality. 2. Sequelae of advanced rheumatoid arthritis. SLAC wrist. [LJ]  1212 Temp(!): 102.4 F (39.1 C) Rectal. [LJ]  1259 I spoke to Silvestre Gunner, PA-C, with orthopedics.  Recommended that I also obtain an MRI with and without contrast of the left wrist.  Will evaluate the patient. [LJ]    Clinical Course User Index [LJ] Rayna Sexton, PA-C   MDM Rules/Calculators/A&P                          Pt is a 87 y.o. female who presents to the emergency department due to pain, swelling, as well as increased warmth in the left wrist.  Patient found to be septic and code sepsis was initiated.  Labs: CBC with a leukocytosis of 11.4, hemoglobin  of 11.2, platelets of 479. BMP with a potassium of 3.1.  Klor-Con ordered.  Glucose of 118, creatinine of 1.13, GFR 47. Troponin of 13 with a repeat of 16. Lactic acid of 1.2.   ESR pending. CRP pending. Respiratory panel in process.  Imaging: X-ray of the left shoulder is negative. X-ray of the left wrist shows no acute osseous abnormalities with sequela of advanced rheumatoid arthritis.  SLAC of the wrist.  Diffuse soft tissue swelling. Chest x-ray shows subsegmental  atelectasis in the left lung base.  Cardiomegaly.  I, Rayna Sexton, PA-C, personally reviewed and evaluated these images and lab results as part of my medical decision-making.  Patient bolused with lactated Ringer's.  Patient started on vancomycin, cefepime, as well as Flagyl.  APAP via rectal suppository.  Patient discussed with Silvestre Gunner, PA-C with orthopedics.  Recommended I order an MRI with and without contrast of the left wrist.  Patient appears delirious at this time and do not think that she will be able to tolerate MRI at the moment.  Patient evaluated by Silvestre Gunner, PA-C as well who is also ordered IR needle aspiration of the joint.  We will admit patient to medicine for continued abx and further workup.  Note: Portions of this report may have been transcribed using voice recognition software. Every effort was made to ensure accuracy; however, inadvertent computerized transcription errors may be present.   Final Clinical Impression(s) / ED Diagnoses Final diagnoses:  Sepsis, due to unspecified organism, unspecified whether acute organ dysfunction present [facility])  Pyogenic arthritis of left wrist, due to unspecified organism [facility])   Rx / DC Orders ED Discharge Orders    None       Rayna Sexton, PA-C 04/11/20 1459    Rayna Sexton, PA-C 04/12/20 0545    Rayna Sexton, PA-C 04/12/20 3536    Charlesetta Shanks, MD 04/15/20 [phone]

## 2020-04-11 NOTE — Sepsis Progress Note (Signed)
Sepsis protocol being followed by [facility]

## 2020-04-11 NOTE — ED Notes (Signed)
Asked Dr. Alfonse Spruce for in and out cath order because pt is incontinent.

## 2020-04-11 NOTE — Progress Notes (Signed)
Orthopedic Tech Progress Note Patient Details:  [place] [facility] 04/19/1929 [id number]  Ortho Devices Type of Ortho Device: Wrist splint Ortho Device/Splint Location: LUE Ortho Device/Splint Interventions: Application,Ordered   Post Interventions Patient Tolerated: Well   Chip Boer 04/11/2020, 6:35 PM

## 2020-04-11 NOTE — ED Notes (Signed)
ED Provider at bedside. 

## 2020-04-11 NOTE — Plan of Care (Signed)

## 2020-04-11 NOTE — Plan of Care (Signed)

## 2020-04-11 NOTE — Consult Note (Signed)
Reason for Consult:Left wrist pain Referring Physician: Calla Kicks Time called: [id number] Time at bedside: Rebecca Hodges is an 87 y.o. female.  HPI: Rebecca Hodges was brought to the ED from the facility where she lives c/o CP that extended into and down her left arm. She was noted to be septic and the left wrist was worrisome for a septic joint and hand surgery was consulted. She is moderately demented and can't contribute meaningfully to history, just keeps saying she hurts all over.  Past Medical History:  Diagnosis Date   Acute hyponatremia 04/2016   Colon cancer ([place]) 10/2012   Stage II a PT 3 PN 0M0 invasive moderately differentiated adenocarcinoma of the ascending colon   Hypertension    Hypokalemia 04/2016   Lymphoma ([place])    Osteoporosis    Rheumatoid arthritis ([place])    Waldenstrom macroglobulinemia ([place])     Past Surgical History:  Procedure Laterality Date   ABDOMINAL HYSTERECTOMY     APPENDECTOMY     PARTIAL COLECTOMY     2014 a sending partial colectomy    Family History  Problem Relation Age of Onset   Arthritis/Rheumatoid Mother     Social History:  reports that she has quit smoking. She has never used smokeless tobacco. She reports that she does not drink alcohol and does not use drugs.  Allergies:  Allergies  Allergen Reactions   Ace Inhibitors    Ciprofloxacin    Penicillins     Unknown (tolerated cefepime 05/18/13 - Rebecca Hodges)    Medications: I have reviewed the patient's current medications.  Results for orders placed or performed during the hospital encounter of 04/11/20 (from the past 48 hour(s))  Basic metabolic panel     Status: Abnormal   Collection Time: 04/11/20  9:37 AM  Result Value Ref Range   Sodium 137 135 - 145 mmol/L   Potassium 3.1 (L) 3.5 - 5.1 mmol/L   Chloride 99 98 - 111 mmol/L   CO2 26 22 - 32 mmol/L   Glucose, Bld 118 (H) 70 - 99 mg/dL    Comment: Glucose reference range applies only to samples taken after  fasting for at least 8 hours.   BUN 20 8 - 23 mg/dL   Creatinine, Ser 1.13 (H) 0.44 - 1.00 mg/dL   Calcium 9.2 8.9 - 10.3 mg/dL   GFR, Estimated 47 (L) >60 mL/min    Comment: (NOTE) Calculated using the CKD-EPI Creatinine Equation (2021)    Anion gap 12 5 - 15    Comment: Performed at [facility] [address], [place], [place] [address]  CBC     Status: Abnormal   Collection Time: 04/11/20  9:37 AM  Result Value Ref Range   WBC 11.4 (H) 4.0 - 10.5 K/uL   RBC 3.73 (L) 3.87 - 5.11 MIL/uL   Hemoglobin 11.2 (L) 12.0 - 15.0 g/dL   HCT 34.1 (L) 36.0 - 46.0 %   MCV 91.4 80.0 - 100.0 fL   MCH 30.0 26.0 - 34.0 pg   MCHC 32.8 30.0 - 36.0 g/dL   RDW 17.8 (H) 11.5 - 15.5 %   Platelets 479 (H) 150 - 400 K/uL   nRBC 0.0 0.0 - 0.2 %    Comment: Performed at [facility] [address], [place], [place] [address]  Troponin I (High Sensitivity)     Status: None   Collection Time: 04/11/20  9:37 AM  Result Value Ref Range   Troponin  I (High Sensitivity) 13 <18 ng/L    Comment: (NOTE) Elevated high sensitivity troponin I (hsTnI) values and significant  changes across serial measurements may suggest ACS but many other  chronic and acute conditions are known to elevate hsTnI results.  Refer to the "Links" section for chest pain algorithms and additional  guidance. Performed at [facility], [place] [address] [place], [place] [address]   Lactic acid, plasma     Status: None   Collection Time: 04/11/20 11:50 AM  Result Value Ref Range   Lactic Acid, Venous 1.2 0.5 - 1.9 mmol/L    Comment: Performed at [facility] [address], [place], [place] [address]  Hepatic function panel     Status: Abnormal   Collection Time: 04/11/20 11:50 AM  Result Value Ref Range   Total Protein 6.1 (L) 6.5 - 8.1 g/dL   Albumin 3.4 (L) 3.5 - 5.0 g/dL   AST 20 15 - 41 U/L   ALT 13 0 - 44 U/L   Alkaline Phosphatase 60 38 - 126 U/L   Total Bilirubin 1.1 0.3 - 1.2 mg/dL   Bilirubin,  Direct 0.2 0.0 - 0.2 mg/dL   Indirect Bilirubin 0.9 0.3 - 0.9 mg/dL    Comment: Performed at [facility] [address] [place], [place] [address]  Lactic acid, plasma     Status: None   Collection Time: 04/11/20 12:31 PM  Result Value Ref Range   Lactic Acid, Venous 1.2 0.5 - 1.9 mmol/L    Comment: Performed at [facility] [address], [place], [place] [address]    DG Chest 2 View  Result Date: 04/11/2020 CLINICAL DATA:  Chest pain. EXAM: CHEST - 2 VIEW COMPARISON:  Single-view of the chest 04/03/2019 and 09/21/2013. FINDINGS: The lungs are emphysematous. Subsegmental atelectasis is seen in the left base. Right lung is clear. No pneumothorax or pleural effusion. Cardiomegaly and aortic atherosclerosis. Port-A-Cath is in place. No acute or focal bony abnormality. IMPRESSION: Subsegmental atelectasis left lung base. Cardiomegaly. Aortic Atherosclerosis (ICD10-I70.0) and Emphysema (ICD10-J43.9). Electronically Signed   By: Inge Rise M.D.   On: 04/11/2020 11:27   DG Wrist Complete Left  Result Date: 04/11/2020 CLINICAL DATA:  Left wrist pain and swelling after a fall. EXAM: LEFT WRIST - COMPLETE 3+ VIEW COMPARISON:  None. FINDINGS: No acute fracture or dislocation. Chronically widened scapholunate interval with proximal capitate migration. Diffuse radiocarpal, midcarpal, and CMC joint space narrowing with scattered erosions of the carpal bones, distal radius, and distal ulna. Increased mineralization within the distal radioulnar joint and along the radial aspect of the radioscaphoid joint. Osteopenia. Diffuse soft tissue swelling about the wrist. IMPRESSION: 1. No acute osseous abnormality. 2. Sequelae of advanced rheumatoid arthritis.  SLAC wrist. Electronically Signed   By: Titus Dubin M.D.   On: 04/11/2020 11:47   DG Shoulder Left  Result Date: 04/11/2020 CLINICAL DATA:  Fall with left shoulder pain. EXAM: LEFT SHOULDER - 2+ VIEW COMPARISON:  None. FINDINGS: No  acute fracture or dislocation identified. Bones are osteopenic. Mild degenerative disease of the [facility] joint. Portions of an indwelling Port-A-Cath are visualized. IMPRESSION: No acute fracture identified. Electronically Signed   By: Aletta Edouard M.D.   On: 04/11/2020 11:40    Review of Systems  Unable to perform ROS: Dementia   Blood pressure (!) 144/90, pulse 78, temperature (!) 102.4 F (39.1 C), temperature source Rectal, resp. rate (!) 24, height 5\' 7"  (1.702 m), weight 57  kg, SpO2 93 %. Physical Exam Constitutional:      General: She is not in acute distress.    Appearance: She is well-developed and well-nourished. She is not diaphoretic.  HENT:     Head: Normocephalic and atraumatic.  Eyes:     General: No scleral icterus.       Right eye: No discharge.        Left eye: No discharge.     Conjunctiva/sclera: Conjunctivae normal.  Cardiovascular:     Rate and Rhythm: Normal rate and regular rhythm.  Pulmonary:     Effort: Pulmonary effort is normal. No respiratory distress.  Musculoskeletal:     Cervical back: Normal range of motion.     Comments: Left shoulder, elbow, wrist, digits- no skin wounds, wrist mod TTP, mild erythema noted, severe pain with attempted PROM, no instability, no blocks to motion  Sens  Ax/R/M/U could not assess  Mot   Ax/ R/ PIN/ M/ AIN/ U could not assess  Rad 2+  Skin:    General: Skin is warm and dry.  Neurological:     Mental Status: She is alert.  Psychiatric:        Mood and Affect: Mood and affect normal.     Comments: Would only repeat "I hurt all over"     Assessment/Plan: R/o left septic wrist -- Have ordered MRI, will see if radiology will aspirate (they usually require a MRI before doing so). Will give wrist splint for comfort for now.    Lisette Abu, PA-C Orthopedic Surgery [phone] 04/11/2020, 1:39 PM

## 2020-04-11 NOTE — Progress Notes (Signed)
Pharmacy Antibiotic Note  Rebecca Hodges is a 87 y.o. female admitted on 04/11/2020 with possible sepsis from unknown source.  Patient endorses L arm, wrist, and shoulder pain.  Pharmacy has been consulted for vancomycin and aztreonam dosing.  Patient has tolerated cefepime on prior admission -- will change aztreonam to cefepime per protocol.  Also Flagyl per MD.   WBC 11.4, Tm 98.6, Scr slightly elevated at 1.13 (BL ~1)  Plan: Vancomycin 1250mg  IV x1, followed by Vancomycin 1250mg  IV q48 hours (AUC goal 400-550, pred 473 using Scr 1.13) Cefepime 2g IV q12 hours Flagyl 500mg  IV x1 F/u renal function for dose adjustments F/u cultures, clinical improvement, vanc levels as indicated     Temp (24hrs), Avg:98.6 F (37 C), Min:98.6 F (37 C), Max:98.6 F (37 C)  Recent Labs  Lab 04/11/20 0937  WBC 11.4*  CREATININE 1.13*    CrCl cannot be calculated (Unknown ideal weight.).    Allergies  Allergen Reactions  . Ace Inhibitors   . Ciprofloxacin   . Penicillins     Unknown (tolerated cefepime 05/18/13 - [facility])    Antimicrobials this admission: Vanc 2/23 >>  Cefepime 2/23 >>  Flagyl 2/23 >> 2/23   Microbiology results: 2/23 BCx: sent   Thank you for allowing pharmacy to be a part of this patient's care.  Dimple Nanas, PharmD PGY-1 Acute Care Pharmacy Resident Office: [phone] 04/11/2020 1:39 PM

## 2020-04-11 NOTE — H&P (Addendum)
Date: 04/11/2020               Patient Name:  Rebecca Hodges MRN: [id number]  DOB: 10-09-1929 Age / Sex: 87 y.o., female   PCP: Pcp, No         Medical Service: Internal Medicine Teaching Service         Attending Physician: Dr. Rebeca Alert Raynaldo Opitz, MD    First Contact: Dr. Alfonse Spruce Pager: [phone]  Second Contact: Dr. Laural Golden Pager: [phone]       After Hours (After 5p/  First Contact Pager: [phone]  weekends / holidays): Second Contact Pager: [phone]   Chief Complaint: left wrist pain  History of Present Illness:  Rebecca Hodges is a 87 year old female with past medical history of Charlcie Cradle, rheumatoid arthritis on methotrexate, colon cancer status post hemicolectomy, right breast cancer status post radical mastectomy, hypertension, A. fib on Xarelto who presented to the ED with code sepsis per SIRS with tachypnea and fever.  Patient is [facility] and was sent to the ED due to her left arm pain.  Patient denies any fall or trauma to the left arm.    Patient appears in distress during examination.  She is alert, awake and oriented to self but not to place and time.  She states that she is hurting everywhere but mostly in the left arm. Denies chest pain, SOB, fever, chills, cough, sore throat, runny nose, abdominal pain, n/v, diarrhea.  Patient is unsure if she has dysuria.    I spoke to patient's daughter, Rebecca Hodges, at bedside.  She states that she saw the patient on Sunday, which patient was in her usual state of health.  She was using the walker on her own.  Patient did not complains of anything at that time.  Rebecca Hodges denies any history of sacral wound on patient. Rebecca Hodges also called patient last night and states that she sounded normal.  Rebecca Hodges states the patient is usually independent and often refused help from the aid.  She likes to do things on her own.   In the ED, code sepsis was called.  Patient was started on vancomycin, cefepime and Flagyl.  She received 2 L  of fluid and was started on LR 150 cc/h.  Lactic acid of 1.2.  CRP 7.6 and sed rate of 35.  Hemoglobin stable at 11.2 with elevated WBC 11.4  Meds:  No outpatient medications have been marked as taking for the 04/11/20 encounter [facility] Encounter).     Allergies: Allergies as of 04/11/2020 - Review Complete 02/15/2020  Allergen Reaction Noted   Ace inhibitors  04/11/2020   Ciprofloxacin  04/11/2020   Penicillins  05/16/2013   Past Medical History:  Diagnosis Date   Acute hyponatremia 04/2016   Colon cancer ([place]) 10/2012   Stage II a PT 3 PN 0M0 invasive moderately differentiated adenocarcinoma of the ascending colon   Hypertension    Hypokalemia 04/2016   Lymphoma ([place])    Osteoporosis    Rheumatoid arthritis ([place])    Waldenstrom macroglobulinemia ([place])     Family History:  Family History  Problem Relation Age of Onset   Arthritis/Rheumatoid Mother     Social History:  Lives in [place]   Review of Systems: A complete ROS was negative except as per HPI.   Physical Exam: Blood pressure (!) 164/96, pulse (!) 54, temperature 99.4 F (37.4 C), temperature source Oral, resp. rate 14, height 5\' 7"  (1.702 m), weight 57 kg, SpO2 93 %.  Physical Exam Constitutional:      General: She is in acute distress.     Comments: Alert, awake, oriented to person only. Able to answer question appropriately.   HENT:     Head: Normocephalic.  Eyes:     General:        Right eye: No discharge.        Left eye: No discharge.  Cardiovascular:     Rate and Rhythm: Tachycardia present. Rhythm irregular.  Pulmonary:     Effort: No respiratory distress.  Abdominal:     General: Bowel sounds are normal.     Tenderness: There is abdominal tenderness (suprapubic tenderness).  Musculoskeletal:        General: Tenderness (Tenderness to right foot palpation ) present.     Right lower leg: No edema.     Left lower leg: No edema.     Comments: Left arm immobile due to  significant pain. Obvious joint effusion of left wrist. Erythema noted. Radial pulse palpated. No skin tears found on left wrist.   Skin:    General: Skin is warm.     Comments: No obvious tenderness to palpation of lower back. Difficult to visualize back side skin due to her pain.   Neurological:     Mental Status: She is alert.  Psychiatric:        Mood and Affect: Mood normal.     EKG: personally reviewed my interpretation is atrial fibrillation with chronic RBBB  CXR: personally reviewed my interpretation is cardiomegaly, left lower lung atelectasis.  Assessment & Plan by Problem: Active Problems:   Sepsis ([place])  Rebecca Hodges is a 87 year old female with past medical history of Charlcie Cradle, rheumatoid arthritis on methotrexate, colon cancer status post hemicolectomy, right breast cancer status post radical mastectomy, hypertension, A. fib on Xarelto who presented to the ED with code sepsis per SIRS. Source of infection is like left wrist septic joint vs urinary source.   Sepsis Left wrist septic joint vs urinary source -Patient presented to the ED for code sepsis with SIRS criteria (fever and tachypnea).  Patient received 2 L of LR in the ED.  Blood pressure currently stable.  Still tachycardic on exam. -Source of infection likely left wrist septic joint VS urinary source.  No other obvious source of infection found on physical exam and imaging.  Patient has history of RA, which left wrist x-ray showed scattered erosion but no acute osseous abnormality.  Hand surgery was consulted and recommended getting MRI. If present abscess, will consult radiology to aspirate. -Patient also has suprapubic tenderness to palpation on exam.  Patient does not remember if she has any dysuria.  Will obtain UA and urine culture. Patient received vancomycin, cefepime and Flagyl in the ED. - Pending blood culture - Pending UA and urine culture - Pending left wrist MRI - LR 150 cc/h - Pain control  with Tylenol and Dilaudid as needed - We will continue vancomycin and cefepime tomorrow.   Rheumatoid arthritis Hold methotrexate in the setting of active infection Hold prednisone due to active infection   Hypertension Holding HCTZ and losartan in the setting of sepsis   Atrial fibrillation Resume Toprol Hold Cardizem in the setting of sepsis Resume Xarelto   History of breast cancer status post right radical mastectomy Resume letrozole  CODE: DNR IVF: LR Diet: NPO at this time DVT: Xarelto  Dispo: Admit patient to Inpatient with expected length of stay greater than 2 midnights.  Signed: Gaylan Gerold, DO  04/11/2020, 5:02 PM  Pager: [phone] After 5pm on weekdays and 1pm on weekends: On Call pager: [phone]

## 2020-04-12 ENCOUNTER — Inpatient Hospital Stay (HOSPITAL_COMMUNITY): Payer: Medicare Other

## 2020-04-12 DIAGNOSIS — I10 Essential (primary) hypertension: Secondary | ICD-10-CM | POA: Diagnosis not present

## 2020-04-12 DIAGNOSIS — M0600 Rheumatoid arthritis without rheumatoid factor, unspecified site: Secondary | ICD-10-CM | POA: Diagnosis not present

## 2020-04-12 DIAGNOSIS — R21 Rash and other nonspecific skin eruption: Secondary | ICD-10-CM

## 2020-04-12 DIAGNOSIS — A419 Sepsis, unspecified organism: Secondary | ICD-10-CM | POA: Diagnosis not present

## 2020-04-12 LAB — GLUCOSE, CAPILLARY: Glucose-Capillary: 92 mg/dL (ref 70–99)

## 2020-04-12 LAB — CBC
HCT: 31.8 % — ABNORMAL LOW (ref 36.0–46.0)
Hemoglobin: 10.6 g/dL — ABNORMAL LOW (ref 12.0–15.0)
MCH: 30.1 pg (ref 26.0–34.0)
MCHC: 33.3 g/dL (ref 30.0–36.0)
MCV: 90.3 fL (ref 80.0–100.0)
Platelets: 408 K/uL — ABNORMAL HIGH (ref 150–400)
RBC: 3.52 MIL/uL — ABNORMAL LOW (ref 3.87–5.11)
RDW: 17.9 % — ABNORMAL HIGH (ref 11.5–15.5)
WBC: 12.4 K/uL — ABNORMAL HIGH (ref 4.0–10.5)
nRBC: 0 % (ref 0.0–0.2)

## 2020-04-12 LAB — BASIC METABOLIC PANEL
Anion gap: 12 (ref 5–15)
BUN: 22 mg/dL (ref 8–23)
CO2: 22 mmol/L (ref 22–32)
Calcium: 8.4 mg/dL — ABNORMAL LOW (ref 8.9–10.3)
Chloride: 101 mmol/L (ref 98–111)
Creatinine, Ser: 1.08 mg/dL — ABNORMAL HIGH (ref 0.44–1.00)
GFR, Estimated: 49 mL/min — ABNORMAL LOW (ref >60)
Glucose, Bld: 100 mg/dL — ABNORMAL HIGH (ref 70–99)
Potassium: 3.2 mmol/L — ABNORMAL LOW (ref 3.5–5.1)
Sodium: 135 mmol/L (ref 135–145)

## 2020-04-12 MED ORDER — POTASSIUM CHLORIDE 20 MEQ PO PACK
40.0000 meq | PACK | Freq: Two times a day (BID) | ORAL | Status: DC
  Administered 2020-04-12 – 2020-04-14 (×6): 40 meq via ORAL
  Filled 2020-04-12 (×7): qty 2

## 2020-04-12 MED ORDER — SODIUM CHLORIDE 0.9 % IV SOLN
INTRAVENOUS | Status: DC | PRN
  Administered 2020-04-12 – 2020-04-13 (×2): 250 mL via INTRAVENOUS

## 2020-04-12 MED ORDER — LOPERAMIDE HCL 2 MG PO CAPS
2.0000 mg | ORAL_CAPSULE | ORAL | Status: DC | PRN
  Administered 2020-04-12: 2 mg via ORAL
  Filled 2020-04-12: qty 1

## 2020-04-12 NOTE — Progress Notes (Signed)
HD#1 Subjective:  Overnight Events: patient had some episodes of loose stool so bowel regimen was held  Patient is seen at bedside.  She is alert, oriented to self but not to time and place.  She appears more comfortable compared to yesterday.  She is feeling sleepy this morning.  States that pain is better today.    Objective:  Vital signs in last 24 hours: Vitals:   04/11/20 1700 04/11/20 1744 04/11/20 2203 04/12/20 0507  BP:  (!) 171/110 104/70 (!) 157/88  Pulse:  (!) 104 (!) 103 (!) 57  Resp:  20 19 15   Temp:  98 F (36.7 C) 98.2 F (36.8 C) 98.4 F (36.9 C)  TempSrc:  Oral    SpO2:  96% 100% 100%  Weight: 53.4 kg  53.4 kg   Height: 5\' 7"  (1.702 m)      Supplemental O2: Room Air SpO2: 100 %   Physical Exam:  Physical Exam Constitutional:      General: She is not in acute distress. HENT:     Head: Normocephalic.  Eyes:     General:        Right eye: No discharge.        Left eye: No discharge.  Cardiovascular:     Rate and Rhythm: Tachycardia present. Rhythm irregular.  Pulmonary:     Effort: Pulmonary effort is normal. No respiratory distress.  Abdominal:     General: Bowel sounds are normal.     Tenderness: There is abdominal tenderness (suprapubic tenderness).  Musculoskeletal:     Right lower leg: No edema.     Left lower leg: No edema.     Comments: Left wrist splint in place. Tenderness to fingers movement  Skin:    General: Skin is warm.  Neurological:     Mental Status: She is alert.  Psychiatric:        Mood and Affect: Mood normal.     Filed Weights   04/11/20 1200 04/11/20 1700 04/11/20 2203  Weight: 57 kg 53.4 kg 53.4 kg     Intake/Output Summary (Last 24 hours) at 04/12/2020 0726 Last data filed at 04/12/2020 0600 Gross per 24 hour  Intake 2678.85 ml  Output 0 ml  Net 2678.85 ml   Net IO Since Admission: 2,678.85 mL [04/12/20 0726]  Pertinent Labs: CBC Latest Ref Rng & Units 04/12/2020 04/11/2020 02/16/2020  WBC 4.0 - 10.5  K/uL 12.4(H) 11.4(H) -  Hemoglobin 12.0 - 15.0 g/dL 10.6(L) 11.2(L) 10.6(L)  Hematocrit 36.0 - 46.0 % 31.8(L) 34.1(L) 33.8(L)  Platelets 150 - 400 K/uL 408(H) 479(H) -    CMP Latest Ref Rng & Units 04/12/2020 04/11/2020 02/15/2020  Glucose 70 - 99 mg/dL 100(H) 118(H) 107(H)  BUN 8 - 23 mg/dL 22 20 28(H)  Creatinine 0.44 - 1.00 mg/dL 1.08(H) 1.13(H) 1.19(H)  Sodium 135 - 145 mmol/L 135 137 134(L)  Potassium 3.5 - 5.1 mmol/L 3.2(L) 3.1(L) 3.8  Chloride 98 - 111 mmol/L 101 99 99  CO2 22 - 32 mmol/L 22 26 26   Calcium 8.9 - 10.3 mg/dL 8.4(L) 9.2 9.3  Total Protein 6.5 - 8.1 g/dL - 6.1(L) 5.6(L)  Total Bilirubin 0.3 - 1.2 mg/dL - 1.1 0.6  Alkaline Phos 38 - 126 U/L - 60 44  AST 15 - 41 U/L - 20 21  ALT 0 - 44 U/L - 13 17    Imaging: DG Chest 2 View  Result Date: 04/11/2020 CLINICAL DATA:  Chest pain. EXAM: CHEST - 2 VIEW  COMPARISON:  Single-view of the chest 04/03/2019 and 09/21/2013. FINDINGS: The lungs are emphysematous. Subsegmental atelectasis is seen in the left base. Right lung is clear. No pneumothorax or pleural effusion. Cardiomegaly and aortic atherosclerosis. Port-A-Cath is in place. No acute or focal bony abnormality. IMPRESSION: Subsegmental atelectasis left lung base. Cardiomegaly. Aortic Atherosclerosis (ICD10-I70.0) and Emphysema (ICD10-J43.9). Electronically Signed   By: Inge Rise M.D.   On: 04/11/2020 11:27   DG Wrist Complete Left  Result Date: 04/11/2020 CLINICAL DATA:  Left wrist pain and swelling after a fall. EXAM: LEFT WRIST - COMPLETE 3+ VIEW COMPARISON:  None. FINDINGS: No acute fracture or dislocation. Chronically widened scapholunate interval with proximal capitate migration. Diffuse radiocarpal, midcarpal, and CMC joint space narrowing with scattered erosions of the carpal bones, distal radius, and distal ulna. Increased mineralization within the distal radioulnar joint and along the radial aspect of the radioscaphoid joint. Osteopenia. Diffuse soft tissue  swelling about the wrist. IMPRESSION: 1. No acute osseous abnormality. 2. Sequelae of advanced rheumatoid arthritis.  SLAC wrist. Electronically Signed   By: Titus Dubin M.D.   On: 04/11/2020 11:47   DG Shoulder Left  Result Date: 04/11/2020 CLINICAL DATA:  Fall with left shoulder pain. EXAM: LEFT SHOULDER - 2+ VIEW COMPARISON:  None. FINDINGS: No acute fracture or dislocation identified. Bones are osteopenic. Mild degenerative disease of the [facility] joint. Portions of an indwelling Port-A-Cath are visualized. IMPRESSION: No acute fracture identified. Electronically Signed   By: Aletta Edouard M.D.   On: 04/11/2020 11:40   DG Foot Complete Right  Result Date: 04/11/2020 CLINICAL DATA:  RIGHT foot pain no injury.  Prior surgery EXAM: RIGHT FOOT COMPLETE - 3+ VIEW COMPARISON:  None. FINDINGS: Large staple fusion of the tarsal bones. No acute osseous erosion or fracture. No soft tissue abnormality. IMPRESSION: No acute findings in the foot. Electronically Signed   By: Suzy Bouchard M.D.   On: 04/11/2020 16:25    Assessment/Plan:   Active Problems:   Sepsis [facility])   Patient Summary: Rebecca Hodges is a 87 year old female with past medical history of Rebecca Hodges, rheumatoid arthritis on methotrexate, colon cancer status post hemicolectomy, right breast cancer status post radical mastectomy, hypertension, A. fib on Xarelto who presented to the ED with code sepsis per SIRS. Source of infection is like left wrist septic joint vs urinary source.    Sepsis Left wrist septic joint vs urinary source Patient's vital sign improved today with normal heart rate and respiratory rate.  Blood pressure stable.  Left arm pain has improved.  MRI study was limited due to patient's inability to cooperate. Will consult IR to aspirate to confirm septic joint vs RA flare.  Patient also has suprapubic tenderness to palpation on exam. UA and urine culture were not collected due to patient's incontinence.  - Blood  culture no growth to date - Pending UA and urine culture - Pain control with Tylenol and Dilaudid as needed - Will continue broad spectrum abx with vancomycin and cefepime    Rheumatoid arthritis -Hold methotrexate in the setting of active infection -Hold prednisone due to active infection.  Patient is taking 5 mg of prednisone daily.  This is a relatively low dose, so lower risk for adrenal insufficiency crisis.  We will continue holding prednisone at this time.  We will give stress dose steroids if exhibits signs and symptoms of decompensation.   Hypertension Holding HCTZ and losartan in the setting of sepsis   Atrial fibrillation Resume Toprol Hold Cardizem  Hold  Xarelto with anticipation that patient will require joint aspiration   History of breast cancer status post right radical mastectomy Resume letrozole  CODE: DNR IVF: NA Diet: regular DVT: SCD  Gaylan Gerold, DO 04/12/2020, 7:26 AM Pager: [phone]  Please contact the on call pager after 5 pm and on weekends at [phone].

## 2020-04-12 NOTE — Plan of Care (Signed)
  Problem: Education: Goal: Knowledge of General Education information will improve Description: Including pain rating scale, medication(s)/side effects and non-pharmacologic comfort measures Outcome: Progressing   Problem: Clinical Measurements: Goal: Will remain free from infection 04/12/2020 0737 by Dolores Hoose, RN Outcome: Progressing 04/11/2020 1822 by Dolores Hoose, RN Outcome: Progressing   Problem: Activity: Goal: Risk for activity intolerance will decrease 04/12/2020 0737 by Dolores Hoose, RN Outcome: Progressing 04/11/2020 1822 by Dolores Hoose, RN Outcome: Progressing   Problem: Nutrition: Goal: Adequate nutrition will be maintained Outcome: Progressing   Problem: Safety: Goal: Ability to remain free from injury will improve Outcome: Progressing

## 2020-04-13 ENCOUNTER — Inpatient Hospital Stay (HOSPITAL_COMMUNITY): Payer: Medicare Other

## 2020-04-13 ENCOUNTER — Ambulatory Visit: Payer: Medicare Other | Admitting: Gastroenterology

## 2020-04-13 DIAGNOSIS — M0600 Rheumatoid arthritis without rheumatoid factor, unspecified site: Secondary | ICD-10-CM | POA: Diagnosis not present

## 2020-04-13 DIAGNOSIS — R21 Rash and other nonspecific skin eruption: Secondary | ICD-10-CM | POA: Diagnosis not present

## 2020-04-13 DIAGNOSIS — A419 Sepsis, unspecified organism: Secondary | ICD-10-CM | POA: Diagnosis not present

## 2020-04-13 DIAGNOSIS — I10 Essential (primary) hypertension: Secondary | ICD-10-CM | POA: Diagnosis not present

## 2020-04-13 LAB — CBC
HCT: 35.3 % — ABNORMAL LOW (ref 36.0–46.0)
Hemoglobin: 11 g/dL — ABNORMAL LOW (ref 12.0–15.0)
MCH: 29.1 pg (ref 26.0–34.0)
MCHC: 31.2 g/dL (ref 30.0–36.0)
MCV: 93.4 fL (ref 80.0–100.0)
Platelets: 384 10*3/uL (ref 150–400)
RBC: 3.78 MIL/uL — ABNORMAL LOW (ref 3.87–5.11)
RDW: 18.1 % — ABNORMAL HIGH (ref 11.5–15.5)
WBC: 10.6 10*3/uL — ABNORMAL HIGH (ref 4.0–10.5)
nRBC: 0 % (ref 0.0–0.2)

## 2020-04-13 LAB — BASIC METABOLIC PANEL
Anion gap: 13 (ref 5–15)
BUN: 24 mg/dL — ABNORMAL HIGH (ref 8–23)
CO2: 20 mmol/L — ABNORMAL LOW (ref 22–32)
Calcium: 8.4 mg/dL — ABNORMAL LOW (ref 8.9–10.3)
Chloride: 105 mmol/L (ref 98–111)
Creatinine, Ser: 1.1 mg/dL — ABNORMAL HIGH (ref 0.44–1.00)
GFR, Estimated: 48 mL/min — ABNORMAL LOW (ref >60)
Glucose, Bld: 85 mg/dL (ref 70–99)
Potassium: 3.8 mmol/L (ref 3.5–5.1)
Sodium: 138 mmol/L (ref 135–145)

## 2020-04-13 LAB — SYNOVIAL FLUID, CRYSTAL

## 2020-04-13 LAB — GLUCOSE, CAPILLARY: Glucose-Capillary: 82 mg/dL (ref 70–99)

## 2020-04-13 MED ORDER — LIDOCAINE HCL (PF) 1 % IJ SOLN
INTRAMUSCULAR | Status: AC
  Filled 2020-04-13: qty 30

## 2020-04-13 NOTE — Progress Notes (Signed)
HD#2 Subjective:  Overnight Events: no event   Patient is seen at bedside.  She appears comfortable and complains of generalized pain.  Also endorses left arm pain that limit her arm mobility. States that her ankles are doing fine.   Objective:  Vital signs in last 24 hours: Vitals:   04/12/20 0954 04/12/20 1843 04/12/20 2042 04/13/20 0512  BP: 133/82 (!) 106/55 (!) 145/76 (!) 159/107  Pulse: 66 85 (!) 102 (!) 108  Resp: 20 18 18 19   Temp: 99.2 F (37.3 C) 98.2 F (36.8 C) 97.8 F (36.6 C) 97.7 F (36.5 C)  TempSrc:      SpO2: 96% 96% 99% 99%  Weight:      Height:       Supplemental O2: Room Air SpO2: 99 %   Physical Exam:  Physical Exam Constitutional:      General: She is in acute distress.  Eyes:     General:        Right eye: No discharge.        Left eye: No discharge.  Cardiovascular:     Rate and Rhythm: Normal rate. Rhythm irregular.  Pulmonary:     Effort: No respiratory distress.  Abdominal:     General: Bowel sounds are normal.     Tenderness: There is abdominal tenderness (diffuse tendereness).  Musculoskeletal:     Right lower leg: No edema.     Left lower leg: No edema.     Comments: Left wrist joint effusion noted.  Very tender to palpation.  Limited range of motion due to pain.  Skin:    General: Skin is warm.  Neurological:     Mental Status: She is alert.     Filed Weights   04/11/20 1200 04/11/20 1700 04/11/20 2203  Weight: 57 kg 53.4 kg 53.4 kg     Intake/Output Summary (Last 24 hours) at 04/13/2020 0636 Last data filed at 04/13/2020 0520 Gross per 24 hour  Intake 1207.33 ml  Output 3 ml  Net 1204.33 ml   Net IO Since Admission: 3,883.18 mL [04/13/20 0636]  Pertinent Labs: CBC Latest Ref Rng & Units 04/13/2020 04/12/2020 04/11/2020  WBC 4.0 - 10.5 K/uL 10.6(H) 12.4(H) 11.4(H)  Hemoglobin 12.0 - 15.0 g/dL 11.0(L) 10.6(L) 11.2(L)  Hematocrit 36.0 - 46.0 % 35.3(L) 31.8(L) 34.1(L)  Platelets 150 - 400 K/uL 384 408(H) 479(H)     CMP Latest Ref Rng & Units 04/13/2020 04/12/2020 04/11/2020  Glucose 70 - 99 mg/dL 85 100(H) 118(H)  BUN 8 - 23 mg/dL 24(H) 22 20  Creatinine 0.44 - 1.00 mg/dL 1.10(H) 1.08(H) 1.13(H)  Sodium 135 - 145 mmol/L 138 135 137  Potassium 3.5 - 5.1 mmol/L 3.8 3.2(L) 3.1(L)  Chloride 98 - 111 mmol/L 105 101 99  CO2 22 - 32 mmol/L 20(L) 22 26  Calcium 8.9 - 10.3 mg/dL 8.4(L) 8.4(L) 9.2  Total Protein 6.5 - 8.1 g/dL - - 6.1(L)  Total Bilirubin 0.3 - 1.2 mg/dL - - 1.1  Alkaline Phos 38 - 126 U/L - - 60  AST 15 - 41 U/L - - 20  ALT 0 - 44 U/L - - 13    Imaging: MR WRIST LEFT WO CONTRAST  Result Date: 04/12/2020 CLINICAL DATA:  Sepsis and delirium. History of rheumatoid arthritis with red, swollen, and warm left wrist. EXAM: MR OF THE LEFT WRIST WITHOUT CONTRAST TECHNIQUE: Multiplanar, multisequence MR imaging of the left wrist was performed. No intravenous contrast was administered. COMPARISON:  Left wrist x-rays  from yesterday. FINDINGS: Only three coronal sequences were obtained as the examination was terminated early due to inability for the patient to cooperate with the exam. There is significant motion artifact limiting evaluation. Sequelae of advanced rheumatoid arthritis with diffuse radiocarpal, midcarpal, and CMC joint space narrowing. Innumerable small erosions involving the distal radius, distal ulna, carpal bones, and bases of the metacarpals. Large distal radioulnar joint effusion. Diffuse soft tissue swelling. IMPRESSION: 1. Significantly limited, incomplete study due to patient's inability to cooperate with the exam. 2. Large distal radioulnar joint effusion is technically nonspecific, but increased mineralization within the joint seen on x-ray favors acute CPPD arthropathy. 3. Sequelae of advanced rheumatoid arthritis. Electronically Signed   By: Titus Dubin M.D.   On: 04/12/2020 14:46    Assessment/Plan:   Principal Problem:   Sepsis ([place]) Active Problems:   Colon cancer  ([place])   Rheumatoid arthritis ([place])   Lymphoma ([place])   Patient Summary: Ghalia Reicks a 87 year old female with past medical history of Charlcie Cradle, rheumatoid arthritis on methotrexate, colon cancer status post hemicolectomy,right breast cancer status post radical mastectomy, hypertension, A. fib on Xarelto who presented to the ED with code sepsisper SIRS. Source of infection is like left wrist septic joint vs urinary source.    Sepsis Left wrist septic joint vs urinary source MRI study was limited, showed large joint effusion with increased mineralization suggests acute CPPD arthropathy.  Plan to aspirate to confirm septic joint vs RA flare vs crystallized arthropathy. UA and urine culture were not collected due to patient's incontinence. May not be accurate given she is already on day 2 of antibiotics.   -Blood culture no growth to date -Pending UA and urine culture -Pain control with Tylenol, Oxycodone and Dilaudid as needed -Will continue broad spectrum abx with vancomycin and cefepime    Rheumatoid arthritis -Hold methotrexate in the setting of active infection -Hold prednisonedue to active infection.  Patient is taking 5 mg of prednisone daily.  This is a relatively low dose, so lower risk for adrenal insufficiency crisis.  We will continue holding prednisone at this time.  We will give stress dose steroids if exhibits signs and symptoms of decompensation.   Hypertension Holding HCTZ and losartan in the setting of sepsis   Atrial fibrillation-rate controlled Resume Toprol Hold Cardizem  Hold Xarelto with anticipation that patient will require joint aspiration   History of breast cancer status post right radical mastectomy Resume letrozole  CODE: DNR IVF: NA Diet: regular DVT:SCD, will resume Xarelto after procedure  Gaylan Gerold, DO 04/13/2020, 6:36 AM Pager: [phone]  Please contact the on call pager after 5 pm and on weekends at  [phone].

## 2020-04-13 NOTE — Progress Notes (Signed)
Daughter voiced concerns regarding stomach and bowel issues from back in December that patient has not been able to follow up on and are still ongoing.

## 2020-04-13 NOTE — Procedures (Signed)
Interventional Radiology Procedure:   Indications: Septic joint  Procedure: US guided aspiration of left wrist  Findings: Small amount of fluid along the volar and ulnar aspect of left wrist. Aspirated 1 ml of yellow purulent fluid.  Complications: None     EBL: Minimal  Plan: Send fluid for analysis   Azalee Weimer R. Anselm Pancoast, MD  Pager: [phone]

## 2020-04-14 ENCOUNTER — Inpatient Hospital Stay (HOSPITAL_COMMUNITY): Payer: Medicare Other

## 2020-04-14 DIAGNOSIS — R21 Rash and other nonspecific skin eruption: Secondary | ICD-10-CM | POA: Diagnosis not present

## 2020-04-14 DIAGNOSIS — M0600 Rheumatoid arthritis without rheumatoid factor, unspecified site: Secondary | ICD-10-CM | POA: Diagnosis not present

## 2020-04-14 DIAGNOSIS — I10 Essential (primary) hypertension: Secondary | ICD-10-CM | POA: Diagnosis not present

## 2020-04-14 DIAGNOSIS — A419 Sepsis, unspecified organism: Secondary | ICD-10-CM | POA: Diagnosis not present

## 2020-04-14 LAB — BASIC METABOLIC PANEL
Anion gap: 12 (ref 5–15)
BUN: 21 mg/dL (ref 8–23)
CO2: 18 mmol/L — ABNORMAL LOW (ref 22–32)
Calcium: 8.2 mg/dL — ABNORMAL LOW (ref 8.9–10.3)
Chloride: 107 mmol/L (ref 98–111)
Creatinine, Ser: 1.03 mg/dL — ABNORMAL HIGH (ref 0.44–1.00)
GFR, Estimated: 52 mL/min — ABNORMAL LOW (ref >60)
Glucose, Bld: 87 mg/dL (ref 70–99)
Potassium: 3.7 mmol/L (ref 3.5–5.1)
Sodium: 137 mmol/L (ref 135–145)

## 2020-04-14 LAB — CBC
HCT: 33 % — ABNORMAL LOW (ref 36.0–46.0)
Hemoglobin: 10.3 g/dL — ABNORMAL LOW (ref 12.0–15.0)
MCH: 29.1 pg (ref 26.0–34.0)
MCHC: 31.2 g/dL (ref 30.0–36.0)
MCV: 93.2 fL (ref 80.0–100.0)
Platelets: 402 10*3/uL — ABNORMAL HIGH (ref 150–400)
RBC: 3.54 MIL/uL — ABNORMAL LOW (ref 3.87–5.11)
RDW: 17.6 % — ABNORMAL HIGH (ref 11.5–15.5)
WBC: 7.7 10*3/uL (ref 4.0–10.5)
nRBC: 0 % (ref 0.0–0.2)

## 2020-04-14 MED ORDER — IOHEXOL 300 MG/ML  SOLN
100.0000 mL | Freq: Once | INTRAMUSCULAR | Status: AC | PRN
  Administered 2020-04-14: 100 mL via INTRAVENOUS

## 2020-04-14 MED ORDER — RIVAROXABAN 15 MG PO TABS
15.0000 mg | ORAL_TABLET | Freq: Every day | ORAL | Status: DC
  Administered 2020-04-14 – 2020-04-18 (×5): 15 mg via ORAL
  Filled 2020-04-14 (×5): qty 1

## 2020-04-14 MED ORDER — HYDROCORTISONE NA SUCCINATE PF 100 MG IJ SOLR
25.0000 mg | Freq: Three times a day (TID) | INTRAMUSCULAR | Status: DC
  Administered 2020-04-14 (×3): 25 mg via INTRAVENOUS
  Filled 2020-04-14 (×3): qty 2

## 2020-04-14 MED ORDER — RIVAROXABAN 10 MG PO TABS: 20.0000 mg | ORAL_TABLET | Freq: Every day | ORAL | Status: DC

## 2020-04-14 NOTE — Evaluation (Signed)
Physical Therapy Evaluation Patient Details Name: Rebecca Hodges MRN: [id number] DOB: 10/11/1929 Today's Date: 04/14/2020   History of Present Illness  Patient is an 87 year old female with a history of RA, hypertension, colon cancer, lymphoma, who presents the emergency department due to chest pain.  Patient brought in by [facility].  Her facility states that she was initially complaining of left-sided chest pain which then began migrating into the left shoulder and then down the left arm.  Patient now is only complaining of pain in the left arm.  Appears to be in the left wrist and shoulder.  Patient not cooperative with questions and due to this it is difficult to obtain a thorough history.  She is DNR and has her paperwork at bedside. Patient's daughter states that she resides in [facility].  She states over the last couple months she has had worsening confusion.   Clinical Impression  Patient received in bed, requesting to use bathroom. Patient agrees to attempt to get up to [facility]. Does not allow assistance due to pain with movement. Able to get sitting up on side of bed with HOB elevated and use of bed rail. Once sitting she states " I don't think I can do it, I just don't feel good." Does not allow me to attempt to assist her with spt to [facility] and requests to lie back down. Patient was able to independently get herself back into bed. RN notified patient will need bedpan. Patient will continue to benefit from skilled PT while here to improve mobility, strength and independence.      Follow Up Recommendations SNF    Equipment Recommendations  None recommended by PT;Other (comment) (TBD)    Recommendations for Other Services       Precautions / Restrictions Precautions Precautions: Fall Restrictions Weight Bearing Restrictions: No      Mobility  Bed Mobility Overal bed mobility: Needs Assistance Bed Mobility: Supine to Sit;Sit to Supine     Supine to sit: HOB elevated;Min  assist Sit to supine: HOB elevated;Modified independent (Device/Increase time)   General bed mobility comments: patient requires some assistance to get to edge of bed, but reports pain when assisting to move her legs to edge of bed.    Transfers                 General transfer comment: unable to attempt. patient would not allow me to assist her to get to [facility] via stand pivot. Requested to lie back down instead  Ambulation/Gait             General Gait Details: unable/unwilling  Stairs            Wheelchair Mobility    Modified Rankin (Stroke Patients Only)       Balance Overall balance assessment: Modified Independent                                           Pertinent Vitals/Pain Pain Assessment: Faces Faces Pain Scale: Hurts whole lot Pain Location: unable to clarify, when I assist her with mobility she asks me not to due to pain Pain Descriptors / Indicators: Grimacing;Guarding;Sore;Discomfort Pain Intervention(s): Monitored during session;Limited activity within patient's tolerance;Repositioned    Home Living Family/patient expects to be discharged to:: Assisted living               Home Equipment: Walker - 4  wheels;Shower seat;Hand held shower head;Grab bars - toilet;Grab bars - tub/shower      Prior Function Level of Independence: Independent with assistive device(s)         Comments: uses rollator     Hand Dominance        Extremity/Trunk Assessment   Upper Extremity Assessment Upper Extremity Assessment: Difficult to assess due to impaired cognition (patient reports pain and not feeling good, unable to elaborate. Pain with mobility in both UE and LEs)    Lower Extremity Assessment Lower Extremity Assessment: Difficult to assess due to impaired cognition       Communication   Communication: No difficulties  Cognition Arousal/Alertness: Awake/alert Behavior During Therapy: WFL for tasks  assessed/performed Overall Cognitive Status: No family/caregiver present to determine baseline cognitive functioning                                 General Comments: patient not oriented, has history of dementia      General Comments      Exercises     Assessment/Plan    PT Assessment Patient needs continued PT services  PT Problem List Decreased strength;Decreased mobility;Decreased activity tolerance;Pain;Decreased cognition       PT Treatment Interventions Therapeutic activities;Therapeutic exercise;Patient/family education;Gait training;Functional mobility training    PT Goals (Current goals can be found in the Care Plan section)  Acute Rehab PT Goals Patient Stated Goal: none stated PT Goal Formulation: With patient Time For Goal Achievement: 04/27/20    Frequency Min 3X/week   Barriers to discharge Decreased caregiver support      Co-evaluation               AM-PAC PT "6 Clicks" Mobility  Outcome Measure Help needed turning from your back to your side while in a flat bed without using bedrails?: A Lot Help needed moving from lying on your back to sitting on the side of a flat bed without using bedrails?: A Lot Help needed moving to and from a bed to a chair (including a wheelchair)?: Total Help needed standing up from a chair using your arms (e.g., wheelchair or bedside chair)?: Total Help needed to walk in hospital room?: Total Help needed climbing 3-5 steps with a railing? : Total 6 Click Score: 8    End of Session   Activity Tolerance: Patient limited by pain Patient left: in bed;with bed alarm set;with call bell/phone within reach Nurse Communication: Mobility status PT Visit Diagnosis: Muscle weakness (generalized) (M62.81);Other abnormalities of gait and mobility (R26.89);Pain Pain - part of body:  (all over- unable to elaborate)    Time: 0950-1001 PT Time Calculation (min) (ACUTE ONLY): 11 min   Charges:   PT Evaluation $PT  Eval Moderate Complexity: 1 Mod          Kristyn Conetta, PT, GCS 04/14/20,10:23 AM

## 2020-04-14 NOTE — Progress Notes (Signed)
Pharmacy Antibiotic Note  Aviyana Sonntag Oelke is a 87 y.o. female admitted on 04/11/2020 with septic joint.  Pharmacy has been consulted for Vanco/Cefepime dosing.  ID: Possible septic joint vs urinary source. - L wrist pain on admit > ortho eval > splint for now - MRI study was limited due to patient's inability to cooperate.   Afebrile, WBC 7.7 down (Pred PTA, held)  CRP 7.6, LA 1.2 x 2, Sed rate 35.   Cefepime 2/23>>  Vanc 2/23 >>  Flagyl x 1 on 2/23   2/25: Wrist synovial fluid>> 2/23 blood x 2: ng < 24 hrs to date 2/23 COVID and flu: negative   Plan: con't same Vancomycin 1250mg  IV q48 hours AUC goal 400-550, predicted 473 using Scr 1.13  - Scr 1.08 (2/24)> predicted AUC 485 Has had 2 doses of Vanco so far. Peak/trough around 4th dose of continued. Cefepime 2g IV q12 hours   Height: 5\' 7"  (170.2 cm) Weight: 53.4 kg (117 lb 11.6 oz) IBW/kg (Calculated) : 61.6  Temp (24hrs), Avg:98.1 F (36.7 C), Min:97.4 F (36.3 C), Max:98.4 F (36.9 C)  Recent Labs  Lab 04/11/20 0937 04/11/20 1150 04/11/20 1231 04/12/20 0250 04/13/20 0240 04/14/20 0304  WBC 11.4*  --   --  12.4* 10.6* 7.7  CREATININE 1.13*  --   --  1.08* 1.10* 1.03*  LATICACIDVEN  --  1.2 1.2  --   --   --     Estimated Creatinine Clearance: 31.8 mL/min (A) (by C-G formula based on SCr of 1.03 mg/dL (H)).    Allergies  Allergen Reactions  . Ace Inhibitors   . Ciprofloxacin   . Penicillins     Unknown (tolerated cefepime 05/18/13 - Thuy)    Taz Vanness S. Alford Highland, PharmD, BCPS Clinical Staff Pharmacist [email or web address] [facility] 04/14/2020 10:38 AM

## 2020-04-14 NOTE — Progress Notes (Signed)
Subjective: Patient this morning states she is not feeling well, aching allover, unable to specify more. Continue to have left wrist pain, some improvement in swelling but still painful.   Objective:  Vital signs in last 24 hours: Vitals:   04/13/20 1006 04/13/20 1627 04/13/20 2141 04/14/20 0535  BP: (!) 152/102 (!) 145/83 (!) 144/94 (!) 157/97  Pulse: (!) 106 91 86 (!) 106  Resp: 18 18 19 15   Temp: 97.8 F (36.6 C) 98.4 F (36.9 C) (!) 97.4 F (36.3 C) 98.4 F (36.9 C)  TempSrc: Oral Oral Oral   SpO2: 97% 94% 100% 99%  Weight:      Height:       Physical Exam Vitals reviewed.  Constitutional:      General: She is not in acute distress.    Appearance: She is not ill-appearing or toxic-appearing.  HENT:     Head: Normocephalic and atraumatic.  Eyes:     Extraocular Movements: Extraocular movements intact.     Conjunctiva/sclera: Conjunctivae normal.  Cardiovascular:     Rate and Rhythm: Normal rate and regular rhythm.     Heart sounds: Normal heart sounds. No murmur heard. No friction rub. No gallop.   Pulmonary:     Effort: Pulmonary effort is normal. No respiratory distress.     Breath sounds: No wheezing or rales.  Abdominal:     General: Bowel sounds are normal. There is no distension.     Tenderness: There is abdominal tenderness. There is guarding.  Musculoskeletal:        General: Swelling, tenderness and deformity present.     Right lower leg: No edema.     Left lower leg: No edema.     Comments: Left wrist swelling and erythema, improved  Skin:    General: Skin is warm and dry.  Neurological:     Mental Status: She is alert and oriented to person, place, and time.  Psychiatric:        Mood and Affect: Mood normal.        Behavior: Behavior normal.        Thought Content: Thought content normal.        Judgment: Judgment normal.     Assessment/Plan:  Principal Problem:   Sepsis ([place]) Active Problems:   Colon cancer ([place])   Rheumatoid arthritis  ([place])   Lymphoma ([place])  Nitara Szczerba a 87 year old female with past medical history of Charlcie Cradle, rheumatoid arthritis on methotrexate, colon cancer status post hemicolectomy,right breast cancer status post radical mastectomy, hypertension, A. fib on Xarelto who presented to the ED with code sepsisper SIRS. Source of infection is like left wrist septic joint vs urinary source.   Sepsis, resolved Suspected left wrist septic arthritis Ultrasound-guided aspiration of the left wrist yielded 1 mL of yellow purulent fluid.  Limited fluid was sent for analysis which showed extracellular calcium pyrophosphate crystals.  Fluid culture shows no growth to date.  Anaerobic culture showed abundant white blood cells present, predominantly PMNs and no organisms seen.  Reconsulted hand surgery who states they will see the patient on Monday.  Patient continues to feel poorly and having significant abdominal tenderness and guarding.  Although sepsis has resolved on current IV antibiotic regimen will obtain CT abdomen pelvis due to significance of clinical symptoms.  UA/urine culture unable to be collected due to patient's urinary incontinence. -Blood cultureno growth to date - Follow-up CT abdomen -Pain control with Tylenol, Oxycodone and Dilaudid as needed -Will continuebroad spectrum abx  withvancomycin and cefepime   Rheumatoid arthritis Patient continues to feel poor overall.  Holding her prednisone may be contributing to this.  She is on 5 mg prednisone daily.  Will start stress doses of IV hydrocortisone 25 mg 3 times daily today and likely resume oral prednisone tomorrow. -Start IV hydrocortisone 25 mg 3 times daily -Continue to hold methotrexate in the setting of infection  Hypertension Blood pressure 139/98 this morning.  Continue to hold hydrochlorothiazide and losartan, can resume if necessary.  Atrial fibrillation-rate controlled Continue Toprol Hold Cardizem Resume Xarelto  today   Prior to Admission Living Arrangement: [facility] Anticipated Discharge Location: SNF Barriers to Discharge: Medical improvement, SNF placement  Dispo: Anticipated discharge pending clinical improvement.  Mike Craze, DO 04/14/2020, 6:31 AM Pager: [phone] After 5pm on weekdays and 1pm on weekends: On Call pager [phone]

## 2020-04-15 LAB — CBC
HCT: 34.3 % — ABNORMAL LOW (ref 36.0–46.0)
Hemoglobin: 10.6 g/dL — ABNORMAL LOW (ref 12.0–15.0)
MCH: 29 pg (ref 26.0–34.0)
MCHC: 30.9 g/dL (ref 30.0–36.0)
MCV: 94 fL (ref 80.0–100.0)
Platelets: 302 10*3/uL (ref 150–400)
RBC: 3.65 MIL/uL — ABNORMAL LOW (ref 3.87–5.11)
RDW: 17.8 % — ABNORMAL HIGH (ref 11.5–15.5)
WBC: 11.4 10*3/uL — ABNORMAL HIGH (ref 4.0–10.5)
nRBC: 0 % (ref 0.0–0.2)

## 2020-04-15 LAB — BASIC METABOLIC PANEL
Anion gap: 11 (ref 5–15)
BUN: 21 mg/dL (ref 8–23)
CO2: 21 mmol/L — ABNORMAL LOW (ref 22–32)
Calcium: 8.1 mg/dL — ABNORMAL LOW (ref 8.9–10.3)
Chloride: 106 mmol/L (ref 98–111)
Creatinine, Ser: 0.95 mg/dL (ref 0.44–1.00)
GFR, Estimated: 58 mL/min — ABNORMAL LOW (ref >60)
Glucose, Bld: 130 mg/dL — ABNORMAL HIGH (ref 70–99)
Potassium: 4.9 mmol/L (ref 3.5–5.1)
Sodium: 138 mmol/L (ref 135–145)

## 2020-04-15 LAB — GLUCOSE, CAPILLARY: Glucose-Capillary: 107 mg/dL — ABNORMAL HIGH (ref 70–99)

## 2020-04-15 MED ORDER — PREDNISONE 5 MG PO TABS
5.0000 mg | ORAL_TABLET | Freq: Every day | ORAL | Status: DC
  Administered 2020-04-15 – 2020-04-18 (×4): 5 mg via ORAL
  Filled 2020-04-15 (×3): qty 1

## 2020-04-15 MED ORDER — VANCOMYCIN HCL 1000 MG/200ML IV SOLN
1000.0000 mg | INTRAVENOUS | Status: DC
  Administered 2020-04-15 – 2020-04-16 (×2): 1000 mg via INTRAVENOUS
  Filled 2020-04-15 (×2): qty 200

## 2020-04-15 MED ORDER — SODIUM CHLORIDE 0.9% FLUSH: 10.0000 mL | INTRAVENOUS | Status: DC | PRN

## 2020-04-15 NOTE — NC FL2 (Signed)
[place] LEVEL OF CARE SCREENING TOOL     IDENTIFICATION  Patient Name: Rebecca Hodges Birthdate: 07-06-1929 Sex: female Admission Date (Current Location): 04/11/2020  [facility] and [place] Number:  Herbalist and Address:  [facility], [place] [address], [place], [place] [address]      Provider Number: [id number]  Attending Physician Name and Address:  Oda Kilts, MD  Relative Name and Phone Number:  Jeryn Bertoni [phone]    Current Level of Care: Hospital Recommended Level of Care: [place] Prior Approval Number:    Date Approved/Denied:   PASRR Number: [id number]  Discharge Plan: SNF    Current Diagnoses: Patient Active Problem List   Diagnosis Date Noted  . Sepsis ([place]) 04/11/2020  . Intractable abdominal pain   . Fecal impaction in rectum ([place])   . Hyponatremia 05/14/2016  . Acute hypokalemia   . Abdominal pain 05/13/2016  . Acute hyponatremia 05/13/2016  . Lymphoma ([place]) 05/13/2016  . CAP (community acquired pneumonia) 05/17/2013  . HCAP (healthcare-associated pneumonia) 05/17/2013  . Colon cancer ([place]) 05/17/2013  . Rheumatoid arthritis ([place]) 05/17/2013  . HTN (hypertension) 05/17/2013  . HLD (hyperlipidemia) 05/17/2013    Orientation RESPIRATION BLADDER Height & Weight     Self  Normal Incontinent,External catheter Weight: 124 lb 5.4 oz (56.4 kg) Height:  5\' 7"  (170.2 cm)  BEHAVIORAL SYMPTOMS/MOOD NEUROLOGICAL BOWEL NUTRITION STATUS      Incontinent Diet (See DC summary)  AMBULATORY STATUS COMMUNICATION OF NEEDS Skin   Extensive Assist   Skin abrasions (Ecchymosis Left and Right arm and leg)                       Personal Care Assistance Level of Assistance  Bathing,Feeding,Dressing Bathing Assistance: Maximum assistance Feeding assistance: Limited assistance Dressing Assistance: Maximum assistance     Functional Limitations Info  Sight,Hearing,Speech  Sight Info: Adequate Hearing Info: Adequate Speech Info: Adequate    SPECIAL CARE FACTORS FREQUENCY  PT (By licensed PT),OT (By licensed OT)     PT Frequency: 5x week OT Frequency: 5x week            Contractures Contractures Info: Not present    Additional Factors Info  Code Status,Allergies Code Status Info: DNR Allergies Info: Ace Inhibitors, Ciproflaxin, Penicillins           Current Medications (04/15/2020):  This is the current hospital active medication list Current Facility-Administered Medications  Medication Dose Route Frequency Provider Last Rate Last Admin  . 0.9 %  sodium chloride infusion   Intravenous PRN Oda Kilts, MD   Stopped at 04/14/20 0022  . acetaminophen (TYLENOL) tablet 650 mg  650 mg Oral Q6H PRN Gaylan Gerold, DO       Or  . acetaminophen (TYLENOL) suppository 650 mg  650 mg Rectal Q6H PRN Gaylan Gerold, DO   650 mg at 04/11/20 1749  . ceFEPIme (MAXIPIME) 2 g in sodium chloride 0.9 % 100 mL IVPB  2 g Intravenous Q12H Gaylan Gerold, DO 200 mL/hr at 04/15/20 1325 2 g at 04/15/20 1325  . folic acid (FOLVITE) tablet 1 mg  1 mg Oral Daily Gaylan Gerold, DO   1 mg at 04/15/20 0955  . HYDROmorphone (DILAUDID) injection 0.5 mg  0.5 mg Intravenous Q2H PRN Rehman, Areeg N, DO   0.5 mg at 04/13/20 1850  . letrozole [facility]) tablet 2.5 mg  2.5 mg Oral Daily Gaylan Gerold, DO  2.5 mg at 04/15/20 0955  . loperamide (IMODIUM) capsule 2 mg  2 mg Oral PRN Sanjuan Dame, MD   2 mg at 04/12/20 1831  . metoprolol succinate (TOPROL-XL) 24 hr tablet 100 mg  100 mg Oral Daily Gaylan Gerold, DO   100 mg at 04/15/20 0955  . oxyCODONE (Oxy IR/ROXICODONE) immediate release tablet 5 mg  5 mg Oral Q4H PRN Gaylan Gerold, DO   5 mg at 04/13/20 1157  . polyethylene glycol (MIRALAX / GLYCOLAX) packet 17 g  17 g Oral Daily PRN Gaylan Gerold, DO      . predniSONE (DELTASONE) tablet 5 mg  5 mg Oral Q breakfast Rehman, Areeg N, DO   5 mg at 04/15/20 1329  . Rivaroxaban (XARELTO)  tablet 15 mg  15 mg Oral Q supper Karren Cobble, RPH   15 mg at 04/15/20 1634  . senna (SENOKOT) tablet 8.6 mg  1 tablet Oral BID Gaylan Gerold, DO   8.6 mg at 04/15/20 0955  . simvastatin (ZOCOR) tablet 10 mg  10 mg Oral QHS Gaylan Gerold, DO   10 mg at 04/14/20 2115  . sodium chloride flush (NS) 0.9 % injection 10-40 mL  10-40 mL Intracatheter PRN Oda Kilts, MD      . vancomycin Alcus Dad) IVPB 1000 mg/200 mL  1,000 mg Intravenous Q36H Karren Cobble, RPH 200 mL/hr at 04/15/20 1206 1,000 mg at 04/15/20 1206     Discharge Medications: Please see discharge summary for a list of discharge medications.  Relevant Imaging Results:  Relevant Lab Results:   Additional Information SS# [place], [place]

## 2020-04-15 NOTE — Consult Note (Signed)
Patient's medical chart was reviewed.  The patient does have the severe advanced rheumatoid arthritis.  That is evidence on the plain radiographs.  The patient presented with pain in the left wrist.  Imaging studies were obtained to include plain radiographs as well as an MRI.  The MRI report is dictated in the chart and was reviewed.  After the conclusion of the MRI the patient underwent ultrasound-guided aspiration by radiology.  Then aspiration did show extracellular crystals consistent with pseudogout, calcium pyrophosphate crystals.  Patient has not grown out any organisms.  The treatment of pseudogout is not a surgical condition at the current time.  Do not recommend any type of surgical intervention.  Would recommend continued treatment of pseudogout unless the results of the aspiration do show any organism growth.  Short arm wrist brace can be helpful to immobilize the wrist.  Please contact me should any issues or concerns arise.  Cell phone number [phone]

## 2020-04-15 NOTE — Progress Notes (Signed)
Pharmacy Antibiotic Note  Rebecca Hodges is a 87 y.o. female admitted on 04/11/2020 with septic joint.  Pharmacy has been consulted for Vanco/Cefepime dosing.  ID: Possible septic joint vs urinary source. - L wrist pain on admit > ortho eval > splint for now - MRI study was limited due to patient's inability to cooperate.   Afebrile, WBC 7.7 >>11.4 up today.(Pred PTA, held), Scr 0.95 down  CRP 7.6, LA 1.2 x 2, Sed rate 35.   Cefepime 2/23>>  Vanc 2/23 >>  Flagyl x 1 on 2/23   2/25: Wrist synovial fluid>> 2/23 blood x 2: ng < 24 hrs to date 2/23 COVID and flu: negative  Vancomycin 1000 mg IV Q 36 hrs. Goal AUC 400-550. Expected AUC: 473.8 SCr used: 0.95  Plan:  Change Vancomycin to 1g IV q36 hrs. Cefepime 2g IV q12 hours Ok to d/c Kdur  Height: 5\' 7"  (170.2 cm) Weight: 56.4 kg (124 lb 5.4 oz) IBW/kg (Calculated) : 61.6  Temp (24hrs), Avg:98 F (36.7 C), Min:97.6 F (36.4 C), Max:98.3 F (36.8 C)  Recent Labs  Lab 04/11/20 0937 04/11/20 1150 04/11/20 1231 04/12/20 0250 04/13/20 0240 04/14/20 0304 04/15/20 0417  WBC 11.4*  --   --  12.4* 10.6* 7.7 11.4*  CREATININE 1.13*  --   --  1.08* 1.10* 1.03* 0.95  LATICACIDVEN  --  1.2 1.2  --   --   --   --     Estimated Creatinine Clearance: 36.4 mL/min (by C-G formula based on SCr of 0.95 mg/dL).    Allergies  Allergen Reactions  . Ace Inhibitors   . Ciprofloxacin   . Penicillins     Unknown (tolerated cefepime 05/18/13 - Thuy)    Rebecca Hodges, PharmD, BCPS Clinical Staff Pharmacist [email or web address] [facility] 04/15/2020 7:42 AM

## 2020-04-15 NOTE — Progress Notes (Addendum)
HD#4 Subjective:  Overnight Events: no event   Patient reports feeling cold this morning. She is not sure how she is feeling as she just woke up. She said yesterday she did not feel well. She feels her usual which is not well. She states it hurts to wiggle her wrist. She thinks the pain is better unless she moves it a lot. She reports having urinary issues at home. She wears adult diapers and endorses urinary incontinence.   Objective:  Vital signs in last 24 hours: Vitals:   04/14/20 1024 04/14/20 2135 04/15/20 0500 04/15/20 0527  BP: (!) 139/98 (!) 128/91  (!) 152/92  Pulse: (!) 108 83  (!) 51  Resp:  14  14  Temp: 98.3 F (36.8 C) 98.1 F (36.7 C)  97.6 F (36.4 C)  TempSrc:      SpO2: 99% 99%  97%  Weight:  56.4 kg 56.4 kg   Height:       Supplemental O2: Room Air SpO2: 97 %   Physical Exam:  Physical Exam Constitutional:      General: She is not in acute distress. HENT:     Head: Normocephalic.  Eyes:     General:        Right eye: No discharge.        Left eye: No discharge.  Cardiovascular:     Rate and Rhythm: Normal rate. Rhythm irregular.  Pulmonary:     Effort: Pulmonary effort is normal. No respiratory distress.  Abdominal:     General: Bowel sounds are normal.     Tenderness: There is abdominal tenderness (Mild tenderness to palpation of lower abdomen).  Musculoskeletal:     Comments: Able to move left arm. Tenderness to palpation of left wrist but improved. Able to move left fingers and make a fist.   Skin:    General: Skin is warm.  Neurological:     Mental Status: She is alert.  Psychiatric:        Mood and Affect: Mood normal.     Filed Weights   04/11/20 2203 04/14/20 2135 04/15/20 0500  Weight: 53.4 kg 56.4 kg 56.4 kg     Intake/Output Summary (Last 24 hours) at 04/15/2020 0711 Last data filed at 04/15/2020 0600 Gross per 24 hour  Intake 560 ml  Output 1100 ml  Net -540 ml   Net IO Since Admission: 4,350.49 mL [04/15/20  0711]  Pertinent Labs: CBC Latest Ref Rng & Units 04/15/2020 04/14/2020 04/13/2020  WBC 4.0 - 10.5 K/uL 11.4(H) 7.7 10.6(H)  Hemoglobin 12.0 - 15.0 g/dL 10.6(L) 10.3(L) 11.0(L)  Hematocrit 36.0 - 46.0 % 34.3(L) 33.0(L) 35.3(L)  Platelets 150 - 400 K/uL 302 402(H) 384    CMP Latest Ref Rng & Units 04/15/2020 04/14/2020 04/13/2020  Glucose 70 - 99 mg/dL 130(H) 87 85  BUN 8 - 23 mg/dL 21 21 24(H)  Creatinine 0.44 - 1.00 mg/dL 0.95 1.03(H) 1.10(H)  Sodium 135 - 145 mmol/L 138 137 138  Potassium 3.5 - 5.1 mmol/L 4.9 3.7 3.8  Chloride 98 - 111 mmol/L 106 107 105  CO2 22 - 32 mmol/L 21(L) 18(L) 20(L)  Calcium 8.9 - 10.3 mg/dL 8.1(L) 8.2(L) 8.4(L)  Total Protein 6.5 - 8.1 g/dL - - -  Total Bilirubin 0.3 - 1.2 mg/dL - - -  Alkaline Phos 38 - 126 U/L - - -  AST 15 - 41 U/L - - -  ALT 0 - 44 U/L - - -    Imaging:  CT ABDOMEN PELVIS W CONTRAST  Result Date: 04/14/2020 CLINICAL DATA:  Acute generalized abdominal pain. EXAM: CT ABDOMEN AND PELVIS WITH CONTRAST TECHNIQUE: Multidetector CT imaging of the abdomen and pelvis was performed using the standard protocol following bolus administration of intravenous contrast. CONTRAST:  143mL OMNIPAQUE IOHEXOL 300 MG/ML  SOLN COMPARISON:  May 13, 2016. FINDINGS: Lower chest: Small bilateral pleural effusions are noted with adjacent subsegmental atelectasis. Hepatobiliary: No gallstones or biliary dilatation is noted. Stable left hepatic cysts are noted. Pancreas: Unremarkable. No pancreatic ductal dilatation or surrounding inflammatory changes. Spleen: Normal in size without focal abnormality. Adrenals/Urinary Tract: Adrenal glands appear normal. Bilateral renal cysts are noted. No hydronephrosis or renal obstruction is noted. No renal or ureteral calculi are noted. Moderate urinary bladder distention is noted. Stomach/Bowel: Stomach appears normal. There is no evidence of bowel obstruction or inflammation. Status post appendectomy. Vascular/Lymphatic:  Atherosclerosis of thoracic aorta is noted. 3.3 cm infrarenal abdominal aortic aneurysm is noted. No adenopathy is noted. Reproductive: Status post hysterectomy. No adnexal masses. Other: No abdominal wall hernia or abnormality. No abdominopelvic ascites. Musculoskeletal: No acute or significant osseous findings. IMPRESSION: 1. Small bilateral pleural effusions are noted with adjacent subsegmental atelectasis. 2. Stable left hepatic and bilateral renal cysts. 3. 3.3 cm infrarenal abdominal aortic aneurysm. Recommend follow-up ultrasound every 3 years. This recommendation follows ACR consensus guidelines: White Paper of the ACR Incidental Findings Committee II on Vascular Findings. J Am Coll Radiol 2013; 10:789-794. 4. Moderate urinary bladder distention is noted. 5. No other significant abnormality seen in the abdomen or pelvis. 6. Aortic atherosclerosis. Aortic Atherosclerosis (ICD10-I70.0). Electronically Signed   By: Marijo Conception M.D.   On: 04/14/2020 18:25    Assessment/Plan:   Principal Problem:   Sepsis ([place]) Active Problems:   Colon cancer ([place])   Rheumatoid arthritis ([place])   Lymphoma ([place])  Aniqa Hare a 87 year old female with past medical history of Charlcie Cradle, rheumatoid arthritis on methotrexate, colon cancer status post hemicolectomy,right breast cancer status post radical mastectomy, hypertension, A. fib on Xarelto who presented to the ED with code sepsisper SIRS. Source of infection is like left wrist septic joint vs RA flare vs crystal arthropathy   Sepsis, resolved Suspected left wrist septic arthritis vs CPPD Fungal: no growth day 2 Anaerobic: no organism, pending culture  Aerobic: no growth day 2 Left arm pain is clinically improved.  Hand surgery, Dr. Caralyn Guile thought that joint aspiration consistent with pseudogout and does not recommend surgical interventions.  Will continue antibiotics given her presentation of sepsis on admission.  We will continue to follow  the culture. CT abdomen/pelvis is unremarkable except for small bilateral pleural effusion with atelectasis. -Pain control with Tylenol, Oxycodoneand Dilaudid as needed -Will continuebroad spectrum abx withvancomycin and cefepime.   Rheumatoid arthritis Patient received stress dose steroid yesterday.  Transition back to home prednisone 5 mg daily -Prednisone 5 mg daily -Continue to hold methotrexate in the setting of infection  Hypertension Blood pressure is soft this morning. Continue to hold hydrochlorothiazide and losartan, can resume if necessary.  Atrial fibrillation-rate controlled Continue Toprol Hold Cardizem Resume Xarelto   Abdominal aortic aneurysm CT abdomen/pelvis showed 3.3 cm infrarenal abdominal aortic aneurysm. - Recommend follow-up ultrasound every 3 years.    Prior to Admission Living Arrangement: [facility] Anticipated Discharge Location: SNF Barriers to Discharge: Medical improvement, SNF placement  Dispo: Anticipated discharge pending clinical improvement.  Gaylan Gerold, DO 04/15/2020, 7:11 AM Pager: [phone]  Please contact the on call pager after 5 pm  and on weekends at [phone].

## 2020-04-15 NOTE — TOC Initial Note (Signed)
Transition of Care [facility]) - Initial/Assessment Note    Patient Details  Name: Rebecca Hodges MRN: [id number] Date of Birth: May 28, 1929  Transition of [facility]) CM/SW Contact:    Coralee Pesa, [place] Phone Number: 04/15/2020, 4:48 PM  Clinical Narrative:                 CSW noticed that pt was disoriented and reached out to dtr [facility]. Rebecca Hodges included her sister Rebecca Hodges on the call. They confirmed that pt lived in [facility] as part of [place] and has been fairly independent up until now. They also noted pt has been to SNF at [facility] in the past, and they are agreeable for her to return. They stated they have already spoken to the director to confirm they have a bed for her. SW will follow up with facility on Monday to confirm bed. SW will continue to follow.  Expected Discharge Plan: Skilled Nursing Facility Barriers to Discharge: Continued Medical Work up,SNF Pending bed offer   Patient Goals and CMS Choice Patient states their goals for this hospitalization and ongoing recovery are:: Patient unable to participate in goal planning due to disorientation. CMS Medicare.gov Compare Post Acute Care list provided to:: Patient Represenative (must comment) (Daughters) Choice offered to / list presented to : Adult Children  Expected Discharge Plan and Services Expected Discharge Plan: [place] Choice: [place] arrangements for the past 2 months: [place]                                      Prior Living Arrangements/Services Living arrangements for the past 2 months: [place] Lives with:: Facility Resident Patient language and need for interpreter reviewed:: Yes Do you feel safe going back to the place where you live?: Yes      Need for Family Participation in Patient Care: Yes (Comment) Care giver support system in place?: Yes (comment)   Criminal Activity/Legal Involvement  Pertinent to Current Situation/Hospitalization: No - Comment as needed  Activities of Daily Living      Permission Sought/Granted Permission sought to share information with : Family Supports Permission granted to share information with : Yes, Verbal Permission Granted  Share Information with NAME: Rebecca Hodges  Permission granted to share info w AGENCY: [facility]  Permission granted to share info w Relationship: Daughter  Permission granted to share info w Contact Information: [phone]  Emotional Assessment Appearance:: Appears stated age Attitude/Demeanor/Rapport: Unable to Assess Affect (typically observed): Unable to Assess Orientation: : Oriented to Self Alcohol / Substance Use: Not Applicable Psych Involvement: No (comment)  Admission diagnosis:  Left wrist pain [M25.532] Sepsis ([place]) [A41.9] Pyogenic arthritis of left wrist, due to unspecified organism ([place]) [M00.9] Sepsis, due to unspecified organism, unspecified whether acute organ dysfunction present [facility]) [A41.9] Patient Active Problem List   Diagnosis Date Noted  . Sepsis ([place]) 04/11/2020  . Intractable abdominal pain   . Fecal impaction in rectum ([place])   . Hyponatremia 05/14/2016  . Acute hypokalemia   . Abdominal pain 05/13/2016  . Acute hyponatremia 05/13/2016  . Lymphoma ([place]) 05/13/2016  . CAP (community acquired pneumonia) 05/17/2013  . HCAP (healthcare-associated pneumonia) 05/17/2013  . Colon cancer ([place]) 05/17/2013  . Rheumatoid arthritis ([place]) 05/17/2013  . HTN (hypertension) 05/17/2013  . HLD (hyperlipidemia) 05/17/2013   PCP:  Pcp, No Pharmacy:   [facility] [place], [place] [address] Phone: [phone] Fax: [phone]     Social Determinants of Health (SDOH) Interventions    Readmission Risk Interventions No flowsheet data found.

## 2020-04-16 DIAGNOSIS — E8779 Other fluid overload: Secondary | ICD-10-CM

## 2020-04-16 DIAGNOSIS — M05731 Rheumatoid arthritis with rheumatoid factor of right wrist without organ or systems involvement: Secondary | ICD-10-CM

## 2020-04-16 DIAGNOSIS — I10 Essential (primary) hypertension: Secondary | ICD-10-CM | POA: Diagnosis not present

## 2020-04-16 DIAGNOSIS — E8770 Fluid overload, unspecified: Secondary | ICD-10-CM

## 2020-04-16 DIAGNOSIS — M009 Pyogenic arthritis, unspecified: Secondary | ICD-10-CM

## 2020-04-16 DIAGNOSIS — A419 Sepsis, unspecified organism: Secondary | ICD-10-CM

## 2020-04-16 DIAGNOSIS — R21 Rash and other nonspecific skin eruption: Secondary | ICD-10-CM | POA: Diagnosis not present

## 2020-04-16 DIAGNOSIS — M05732 Rheumatoid arthritis with rheumatoid factor of left wrist without organ or systems involvement: Secondary | ICD-10-CM

## 2020-04-16 DIAGNOSIS — C182 Malignant neoplasm of ascending colon: Secondary | ICD-10-CM | POA: Diagnosis not present

## 2020-04-16 DIAGNOSIS — M0600 Rheumatoid arthritis without rheumatoid factor, unspecified site: Secondary | ICD-10-CM | POA: Diagnosis not present

## 2020-04-16 LAB — BASIC METABOLIC PANEL
Anion gap: 7 (ref 5–15)
BUN: 22 mg/dL (ref 8–23)
CO2: 20 mmol/L — ABNORMAL LOW (ref 22–32)
Calcium: 7.7 mg/dL — ABNORMAL LOW (ref 8.9–10.3)
Chloride: 110 mmol/L (ref 98–111)
Creatinine, Ser: 0.96 mg/dL (ref 0.44–1.00)
GFR, Estimated: 57 mL/min — ABNORMAL LOW (ref >60)
Glucose, Bld: 102 mg/dL — ABNORMAL HIGH (ref 70–99)
Potassium: 3.8 mmol/L (ref 3.5–5.1)
Sodium: 137 mmol/L (ref 135–145)

## 2020-04-16 LAB — CBC
HCT: 31.7 % — ABNORMAL LOW (ref 36.0–46.0)
Hemoglobin: 10 g/dL — ABNORMAL LOW (ref 12.0–15.0)
MCH: 29.4 pg (ref 26.0–34.0)
MCHC: 31.5 g/dL (ref 30.0–36.0)
MCV: 93.2 fL (ref 80.0–100.0)
Platelets: 273 10*3/uL (ref 150–400)
RBC: 3.4 MIL/uL — ABNORMAL LOW (ref 3.87–5.11)
RDW: 18.1 % — ABNORMAL HIGH (ref 11.5–15.5)
WBC: 16.3 10*3/uL — ABNORMAL HIGH (ref 4.0–10.5)
nRBC: 0 % (ref 0.0–0.2)

## 2020-04-16 LAB — CULTURE, BLOOD (ROUTINE X 2)
Culture: NO GROWTH
Culture: NO GROWTH

## 2020-04-16 LAB — BODY FLUID CULTURE W GRAM STAIN: Culture: NO GROWTH

## 2020-04-16 LAB — GLUCOSE, CAPILLARY: Glucose-Capillary: 78 mg/dL (ref 70–99)

## 2020-04-16 MED ORDER — CHLORHEXIDINE GLUCONATE CLOTH 2 % EX PADS
6.0000 | MEDICATED_PAD | Freq: Every day | CUTANEOUS | Status: DC
  Administered 2020-04-16 – 2020-04-18 (×3): 6 via TOPICAL

## 2020-04-16 MED ORDER — DIPHENHYDRAMINE HCL 25 MG PO CAPS
25.0000 mg | ORAL_CAPSULE | Freq: Four times a day (QID) | ORAL | Status: AC | PRN
  Administered 2020-04-16: 25 mg via ORAL
  Filled 2020-04-16: qty 1

## 2020-04-16 MED ORDER — SODIUM CHLORIDE 0.9% FLUSH: 10.0000 mL | INTRAVENOUS | Status: DC | PRN

## 2020-04-16 MED ORDER — HYDROCERIN EX CREA
TOPICAL_CREAM | Freq: Two times a day (BID) | CUTANEOUS | Status: DC
  Filled 2020-04-16: qty 113

## 2020-04-16 NOTE — Progress Notes (Signed)
Pt is confused and pulled out midline. Catheter and everything was all together. RN will make MD and on-coming RN aware.    Eleanora Neighbor, RN

## 2020-04-16 NOTE — Progress Notes (Signed)
Physical Therapy Treatment Patient Details Name: Rebecca Hodges MRN: [id number] DOB: 09/12/1929 Today's Date: 04/16/2020    History of Present Illness Patient is an 87 year old female with a history of RA, hypertension, colon cancer, lymphoma, who presents the emergency department due to chest pain.  Patient brought in by [facility].  Her facility states that she was initially complaining of left-sided chest pain which then began migrating into the left shoulder and then down the left arm.  Patient now is only complaining of pain in the left arm.  Appears to be in the left wrist and shoulder.  Patient not cooperative with questions and due to this it is difficult to obtain a thorough history.  She is DNR and has her paperwork at bedside.     I spoke to the patient's daughter.  She states that she resides in [facility].  She states over the last couple months she has had worsening confusion.    PT Comments    Pt received in bed, stating her "[place]" dessert was good although the label read banana pudding. Pt stating that she wants to get stronger and walk again, but illogically stating she won't try and needs to stay in bed. Did initially agree to mobilizing to EOB with use of bed rails and min A for trunk control, but stopped before putting her feet on the ground and insisted on going back to sleep. Unable to redirect pt. History of cognitive impairments at baseline, demonstrated decreased memory, attention, and problem solving. Will attempt OOB at next visit. Pt left in bed with all needs met, call bell within reach, and bed alarm active.    Follow Up Recommendations  SNF     Equipment Recommendations  None recommended by PT;Other (comment) (TBD)    Recommendations for Other Services       Precautions / Restrictions Precautions Precautions: Fall Restrictions Weight Bearing Restrictions: No    Mobility  Bed Mobility Overal bed mobility: Needs Assistance Bed Mobility: Supine to  Sit;Sit to Supine     Supine to sit: HOB elevated;Min assist Sit to supine: HOB elevated;Modified independent (Device/Increase time)   General bed mobility comments: Pt attempting to come to EOB. Moved her legs to EOB but would not bring them forward to touch the ground. Refused to continue and laid back down.    Transfers                 General transfer comment: refused to attempt  Ambulation/Gait             General Gait Details: refused   Stairs             Wheelchair Mobility    Modified Rankin (Stroke Patients Only)       Balance Overall balance assessment: Modified Independent;Needs assistance   Sitting balance-Leahy Scale: Poor Sitting balance - Comments: Uses UE support in sitting       Standing balance comment: Unable to assess                            Cognition Arousal/Alertness: Awake/alert Behavior During Therapy: WFL for tasks assessed/performed Overall Cognitive Status: No family/caregiver present to determine baseline cognitive functioning Area of Impairment: Memory;Attention;Following commands;Safety/judgement;Awareness                   Current Attention Level: Sustained Memory: Decreased recall of precautions;Decreased short-term memory Following Commands: Follows one step commands inconsistently Safety/Judgement: Decreased awareness  of safety Awareness: Emergent   General Comments: Hx of dementia.      Exercises      General Comments        Pertinent Vitals/Pain Pain Assessment: Faces Faces Pain Scale: Hurts little more Pain Location: ribs, trunk, generalized Pain Descriptors / Indicators: Grimacing;Guarding;Sore;Discomfort Pain Intervention(s): Limited activity within patient's tolerance;Monitored during session    Home Living                      Prior Function            PT Goals (current goals can now be found in the care plan section)      Frequency    Min  3X/week      PT Plan      Co-evaluation              AM-PAC PT "6 Clicks" Mobility   Outcome Measure  Help needed turning from your back to your side while in a flat bed without using bedrails?: A Lot Help needed moving from lying on your back to sitting on the side of a flat bed without using bedrails?: A Lot Help needed moving to and from a bed to a chair (including a wheelchair)?: Total Help needed standing up from a chair using your arms (e.g., wheelchair or bedside chair)?: Total Help needed to walk in hospital room?: Total Help needed climbing 3-5 steps with a railing? : Total 6 Click Score: 8    End of Session   Activity Tolerance: Patient limited by pain Patient left: in bed;with bed alarm set;with call bell/phone within reach Nurse Communication: Mobility status PT Visit Diagnosis: Muscle weakness (generalized) (M62.81);Other abnormalities of gait and mobility (R26.89);Pain Pain - part of body:  (all over- unable to elaborate)     Time:  -     Charges:                        Rosita Kea, SPT

## 2020-04-16 NOTE — TOC Progression Note (Signed)
Transition of Care [facility]) - Progression Note    Patient Details  Name: Preeya Cleckley MRN: [id number] Date of Birth: 1929-09-16  Transition of Care [facility]) CM/SW Contact  [name], LCSW Phone Number: 04/16/2020, 6:24 PM  Clinical Narrative:   Talked with Dorene Sorrow, admissions staff person at [facility] and confirmed that patient lives at [facility]. Talked with daughter, Tawanna Solo outside of patient's room regarding discharge. CSW advised that her mother started out in independent living at [facility] about 9 years ago and has been at [facility] for approx 9 years. CSW advised that Herbie Baltimore runs [employer]. Per daughter the plan is SNF for ST rehab and hopefully back to [facility]. CSW talked with daughter regarding [place] services at d/c from SNF and she can add more aide services (paying privately if they choose for additional aide services), and daughter expressed understanding.  CSW will continue to follow and contact will be made with Jerilynn Som, admissions staff person at [facility] regarding SNF placement.   Expected Discharge Plan: [place] Barriers to Discharge: Continued Medical Work up,SNF Pending bed offer  Expected Discharge Plan and Services Expected Discharge Plan: [place] Choice: [place] arrangements for the past 2 months: Assisted Living Facility                                      Social Determinants of Health (SDOH) Interventions    Readmission Risk Interventions No flowsheet data found.

## 2020-04-16 NOTE — Progress Notes (Signed)
Paged provider regarding bladder scan (502) and output (120)  and patient did not want the In and out.  Provider called back and OK to not do In and out at this time.  Urine sample sent per provider request.

## 2020-04-16 NOTE — Consult Note (Addendum)
[facility] for Infectious Disease    Date of Admission:  04/11/2020     Total days of antibiotics 5   Vancomycin + Cefepime              Reason for Consult: ?septic L wrist    Referring Provider: Rebeca Alert Primary Care Provider: Pcp, No   Assessment: Rebecca Hodges is a 87 y.o. female with complicated medical history including Charlcie Cradle, RA on methotrexate and low dose prednisone, colon cancer s/p hemicolectomy, right breast cancer s/p radical mastectomy, HTN, afib on chronic Xarelto admitted from [facility] with concerns for sepsis and left wrist pain. Aspiration of the joint was not very high yield given the patient's tolerance of the procedure and MRI not helpful d/t motion artifact. Not clear cut picture for infection -- differential for pseudogout vs infection vs RA flare; however with description of pseudogout would err on the cautious side. Would like to treat with doxycycline PO (would avoid vancomycin for her given poor kidney function already and SNF plan) and consider cefazolin to help provide good strep coverage while decreasing risk for c diff infection hopefully.   She has a port in place - would this be something that they can keep accessed vs placing a new line (would prefer to not do that if possible given she pulled out a midline here in the hospital).   H/O PCN allergy documented - will see if we can find any previous history of cefazolin or cephalexin tolerance in the past. She does have a slight rash overlying the left forearm but no where else at this time.    Sed Rate (mm/hr)  Date Value  04/11/2020 35 (H)  05/13/2016 9   CRP (mg/dL)  Date Value  04/11/2020 7.6 (H)  05/13/2016 11.8 (H)     Plan: 1. Would recommend doxycycline PO with food BID + Cefazolin IV renally dosed x 4 weeks 2. Will try to investigate tomorrow 3/1 if she has had any exposure to 1st gen cephalosporins in the past 3. Can SNF accept the accessed  chest port? Would feel this is safer for the patient if possible.      Principal Problem:   Sepsis ([place]) Active Problems:   Colon cancer ([place])   Rheumatoid arthritis ([place])   Lymphoma ([place])   . Chlorhexidine Gluconate Cloth  6 each Topical Daily  . folic acid  1 mg Oral Daily  . hydrocerin   Topical BID  . letrozole  2.5 mg Oral Daily  . metoprolol succinate  100 mg Oral Daily  . predniSONE  5 mg Oral Q breakfast  . rivaroxaban  15 mg Oral Q supper  . senna  1 tablet Oral BID  . simvastatin  10 mg Oral QHS    HPI: Rebecca Hodges is a 87 y.o. female admitted from [place] assisted living with complicated medical history for left wrist pain.   History obtained from chart review. When we spoke with the patient she kept saying "I wish my kids were here to help tell you who I am and what I act like". She does report to be feeling pretty "back to normal today."   History of Charlcie Cradle, RA on methotrexate and low dose prednisone, colon cancer s/p hemicolectomy, right breast cancer s/p radical mastectomy, HTN, afib on chronic Xarelto. She had features c/w sepsis presentation with fever, tachypnea. Started on broad spectrum antibiotics with vancomycin and cefepime. Blood cultures were  drawn and negative. She has chronic deformities from RA including b/l fused wrists.   Hand surgery was consulted with concern for septic joint - small amount of purulent/yellow fluid was aspirated in IR. No growth on culture. No cell count done d/t small volume fluid. +crystals concerning for pseudogout.  She has remained afebrile since starting on antibiotics.   Review of Systems: Review of Systems  Unable to perform ROS: Mental status change    Past Medical History:  Diagnosis Date  . Acute hyponatremia 04/2016  . Colon cancer ([place]) 10/2012   Stage II a PT 3 PN 0M0 invasive moderately differentiated adenocarcinoma of the ascending colon  . Hypertension   . Hypokalemia 04/2016  . Lymphoma  ([place])   . Osteoporosis   . Rheumatoid arthritis ([place])   . Waldenstrom macroglobulinemia (HCC)     Social History   Tobacco Use  . Smoking status: Former Research scientist (life sciences)  . Smokeless tobacco: Never Used  Substance Use Topics  . Alcohol use: No  . Drug use: No    Family History  Problem Relation Age of Onset  . Arthritis/Rheumatoid Mother    Allergies  Allergen Reactions  . Ace Inhibitors   . Ciprofloxacin   . Penicillins     Unknown (tolerated cefepime 05/18/13 - [facility])    OBJECTIVE: Blood pressure 120/76, pulse 95, temperature 97.8 F (36.6 C), temperature source Oral, resp. rate 15, height 5\' 7"  (1.702 m), weight 56.4 kg, SpO2 100 %.  Physical Exam HENT:     Mouth/Throat:     Mouth: No oral lesions.     Dentition: No dental abscesses.  Cardiovascular:     Rate and Rhythm: Normal rate and regular rhythm.     Heart sounds: Normal heart sounds.  Pulmonary:     Effort: Pulmonary effort is normal.     Breath sounds: Normal breath sounds.  Abdominal:     General: There is no distension.     Palpations: Abdomen is soft.     Tenderness: There is no abdominal tenderness.  Musculoskeletal:     Comments: Chronic deformities noted d/t RA of bilateral hands.  L wrist non-tender non painful.  Rash overlying LUE with underlying redness.   Lymphadenopathy:     Cervical: No cervical adenopathy.  Skin:    General: Skin is warm and dry.     Findings: No rash.  Neurological:     Mental Status: She is alert and oriented to person, place, and time.  Psychiatric:        Judgment: Judgment normal.     Lab Results Lab Results  Component Value Date   WBC 16.3 (H) 04/16/2020   HGB 10.0 (L) 04/16/2020   HCT 31.7 (L) 04/16/2020   MCV 93.2 04/16/2020   PLT 273 04/16/2020    Lab Results  Component Value Date   CREATININE 0.96 04/16/2020   BUN 22 04/16/2020   NA 137 04/16/2020   K 3.8 04/16/2020   CL 110 04/16/2020   CO2 20 (L) 04/16/2020    Lab Results  Component Value Date    ALT 13 04/11/2020   AST 20 04/11/2020   ALKPHOS 60 04/11/2020   BILITOT 1.1 04/11/2020     Microbiology: Recent Results (from the past 240 hour(s))  Culture, blood (routine x 2)     Status: None   Collection Time: 04/11/20 10:36 AM   Specimen: BLOOD  Result Value Ref Range Status   Specimen Description BLOOD SITE NOT SPECIFIED  Final   Special  Requests   Final    BOTTLES DRAWN AEROBIC AND ANAEROBIC Blood Culture results may not be optimal due to an excessive volume of blood received in culture bottles   Culture   Final    NO GROWTH 5 DAYS Performed at [facility], [place] [address] [place], [place] [address]    Report Status 04/16/2020 FINAL  Final  Culture, blood (routine x 2)     Status: None   Collection Time: 04/11/20 11:50 AM   Specimen: BLOOD RIGHT FOREARM  Result Value Ref Range Status   Specimen Description BLOOD RIGHT FOREARM  Final   Special Requests   Final    BOTTLES DRAWN AEROBIC AND ANAEROBIC Blood Culture results may not be optimal due to an inadequate volume of blood received in culture bottles   Culture   Final    NO GROWTH 5 DAYS Performed at [facility], [place] [address] [place], [place] [address]    Report Status 04/16/2020 FINAL  Final  Resp Panel by RT-PCR (Flu A&B, Covid) Nasopharyngeal Swab     Status: None   Collection Time: 04/11/20  1:27 PM   Specimen: Nasopharyngeal Swab; Nasopharyngeal(NP) swabs in vial transport medium  Result Value Ref Range Status   SARS Coronavirus 2 by RT PCR NEGATIVE NEGATIVE Final    Comment: (NOTE) SARS-CoV-2 target nucleic acids are NOT DETECTED.  The SARS-CoV-2 RNA is generally detectable in upper respiratory specimens during the acute phase of infection. The lowest concentration of SARS-CoV-2 viral copies this assay can detect is 138 copies/mL. A negative result does not preclude SARS-Cov-2 infection and should not be used as the sole basis for treatment or other patient management decisions. A negative  result may occur with  improper specimen collection/handling, submission of specimen other than nasopharyngeal swab, presence of viral mutation(s) within the areas targeted by this assay, and inadequate number of viral copies(<138 copies/mL). A negative result must be combined with clinical observations, patient history, and epidemiological information. The expected result is Negative.  Fact Sheet for Patients:  EntrepreneurPulse.com.au  Fact Sheet for Healthcare Providers:  IncredibleEmployment.be  This test is no t yet approved or cleared by the [place] FDA and  has been authorized for detection and/or diagnosis of SARS-CoV-2 by FDA under an Emergency Use Authorization (EUA). This EUA will remain  in effect (meaning this test can be used) for the duration of the COVID-19 declaration under Section 564(b)(1) of the Act, 21 U.S.C.section 360bbb-3(b)(1), unless the authorization is terminated  or revoked sooner.       Influenza A by PCR NEGATIVE NEGATIVE Final   Influenza B by PCR NEGATIVE NEGATIVE Final    Comment: (NOTE) The Xpert Xpress SARS-CoV-2/FLU/RSV plus assay is intended as an aid in the diagnosis of influenza from Nasopharyngeal swab specimens and should not be used as a sole basis for treatment. Nasal washings and aspirates are unacceptable for Xpert Xpress SARS-CoV-2/FLU/RSV testing.  Fact Sheet for Patients: EntrepreneurPulse.com.au  Fact Sheet for Healthcare Providers: IncredibleEmployment.be  This test is not yet approved or cleared by the [place] FDA and has been authorized for detection and/or diagnosis of SARS-CoV-2 by FDA under an Emergency Use Authorization (EUA). This EUA will remain in effect (meaning this test can be used) for the duration of the COVID-19 declaration under Section 564(b)(1) of the Act, 21 U.S.C. section 360bbb-3(b)(1), unless the authorization is  terminated or revoked.  Performed at [facility], [place] [address], [place], [place] [address]  Body fluid culture w Gram Stain     Status: None   Collection Time: 04/13/20  4:01 PM   Specimen: Fluid  Result Value Ref Range Status   Specimen Description FLUID SYNOVIAL  Final   Special Requests LEFT WRIST JOINT  Final   Gram Stain   Final    MODERATE WBC PRESENT,BOTH PMN AND MONONUCLEAR NO ORGANISMS SEEN    Culture   Final    NO GROWTH 3 DAYS Performed at [facility], [address], [place], [place] [address]    Report Status 04/16/2020 FINAL  Final  Anaerobic culture w Gram Stain     Status: None (Preliminary result)   Collection Time: 04/13/20  4:01 PM   Specimen: Fluid  Result Value Ref Range Status   Specimen Description FLUID SYNOVIAL  Final   Special Requests LEFT WRIST JOINT  Final   Gram Stain   Final    ABUNDANT WBC PRESENT, PREDOMINANTLY PMN NO ORGANISMS SEEN Performed at [facility], [place] [address] [place], [place] [address]    Culture   Final    NO ANAEROBES ISOLATED; CULTURE IN PROGRESS FOR 5 DAYS   Report Status PENDING  Incomplete  Culture, fungus without smear     Status: None (Preliminary result)   Collection Time: 04/13/20  4:12 PM   Specimen: PATH Cytology Misc. fluid; Synovial Fluid  Result Value Ref Range Status   Specimen Description FLUID SYNOVIAL  Final   Special Requests LEFT WRIST JOINT  Final   Culture   Final    NO FUNGUS ISOLATED AFTER 2 DAYS Performed at [facility], [address], [place], [place] [address]    Report Status PENDING  Incomplete     Janene Madeira, MSN, NP-C [facility] for Infectious Disease [email or web address] Pager: [phone] Office: [phone] [place]: [phone]

## 2020-04-16 NOTE — Plan of Care (Signed)
  Problem: Health Behavior/Discharge Planning: Goal: Ability to manage health-related needs will improve Outcome: Progressing   Problem: Activity: Goal: Risk for activity intolerance will decrease Outcome: Not Progressing   

## 2020-04-16 NOTE — Progress Notes (Addendum)
HD#5 Subjective:  Overnight Events: patient was confused last night, pulled out her IV.    States she generally does not feel well cannot specify more. States back is very itchy. Generally feels tired.  Denies pain. Feels here wrist are frozen and has been like that for a long time. Feels her hand is less swollen. Has not feel much different since restarting steroids.  Objective:  Vital signs in last 24 hours: Vitals:   04/15/20 1746 04/15/20 2042 04/16/20 0429 04/16/20 0500  BP: 121/87 118/81 (!) 146/112   Pulse: 89 87 84   Resp: 18 16 18    Temp: 98.1 F (36.7 C) 98.4 F (36.9 C) 97.7 F (36.5 C)   TempSrc:  Oral Oral   SpO2: 98% 98% 100%   Weight:    56.4 kg  Height:       Supplemental O2: Room Air SpO2: 100 %   Physical Exam:  Physical Exam Constitutional:      General: She is in acute distress.     Appearance: She is ill-appearing.  HENT:     Head: Normocephalic.  Eyes:     General:        Right eye: No discharge.        Left eye: No discharge.  Cardiovascular:     Rate and Rhythm: Normal rate and regular rhythm.  Pulmonary:     Effort: No respiratory distress.  Abdominal:     General: Bowel sounds are normal.  Musculoskeletal:     Comments: Left wrist not tender to touch. Able to make a fist with left hand. Erythema improved.   Skin:    General: Skin is warm.     Comments: Numerous truncal erythematous macular rash  Neurological:     Mental Status: She is alert.     Filed Weights   04/14/20 2135 04/15/20 0500 04/16/20 0500  Weight: 56.4 kg 56.4 kg 56.4 kg     Intake/Output Summary (Last 24 hours) at 04/16/2020 0728 Last data filed at 04/16/2020 0200 Gross per 24 hour  Intake 1200 ml  Output --  Net 1200 ml   Net IO Since Admission: 5,550.49 mL [04/16/20 0728]  Pertinent Labs: CBC Latest Ref Rng & Units 04/16/2020 04/15/2020 04/14/2020  WBC 4.0 - 10.5 K/uL 16.3(H) 11.4(H) 7.7  Hemoglobin 12.0 - 15.0 g/dL 10.0(L) 10.6(L) 10.3(L)  Hematocrit  36.0 - 46.0 % 31.7(L) 34.3(L) 33.0(L)  Platelets 150 - 400 K/uL 273 302 402(H)    CMP Latest Ref Rng & Units 04/16/2020 04/15/2020 04/14/2020  Glucose 70 - 99 mg/dL 102(H) 130(H) 87  BUN 8 - 23 mg/dL 22 21 21   Creatinine 0.44 - 1.00 mg/dL 0.96 0.95 1.03(H)  Sodium 135 - 145 mmol/L 137 138 137  Potassium 3.5 - 5.1 mmol/L 3.8 4.9 3.7  Chloride 98 - 111 mmol/L 110 106 107  CO2 22 - 32 mmol/L 20(L) 21(L) 18(L)  Calcium 8.9 - 10.3 mg/dL 7.7(L) 8.1(L) 8.2(L)  Total Protein 6.5 - 8.1 g/dL - - -  Total Bilirubin 0.3 - 1.2 mg/dL - - -  Alkaline Phos 38 - 126 U/L - - -  AST 15 - 41 U/L - - -  ALT 0 - 44 U/L - - -    Imaging: No results found.  Assessment/Plan:   Principal Problem:   Sepsis ([place]) Active Problems:   Colon cancer ([place])   Rheumatoid arthritis ([place])   Lymphoma ([place])   Patient Summary: Rebecca Hodges is a 87 year old female with past  medical history of Waldenstrom macroglobulinemia, rheumatoid arthritis on methotrexate, colon cancer status post hemicolectomy, right breast cancer status post radical mastectomy, hypertension, A. fib on Xarelto who presented to the ED with code sepsis per SIRS. Source of infection is likely left wrist septic joint vs RA flare vs crystal arthropathy. She is clinically improving.     Sepsis, resolved Suspected left wrist septic arthritis vs CPPD Fungal: no growth day 3 Anaerobic: no organism, pending culture  Aerobic: no growth day 2 Left arm is clinically improved.  No fever in the last 24h. Will continue antibiotics given her presentation of sepsis on admission and follow culture. We consulted ID today for recommendations on antibiotic regimen.  Also found to have CPPD on joint aspiration, currently on prednisone 5 mg daily.  - Appreciate ID recommendations - Pain control with Tylenol, Oxycodone and Dilaudid as needed - Will continue broad spectrum abx with vancomycin and cefepime.    Rheumatoid arthritis -Prednisone 5 mg daily -Continue  to hold methotrexate in the setting of infection  Rash New pruritic rash noted today. Antibiotic related vs heat rash. Pending ID recs on antibiotics region. Will trial Eucerin cream on her back for moisturizer.   Hypertension Blood pressure is normotensive today.  Continue to hold hydrochlorothiazide and losartan, can resume if necessary.    Atrial fibrillation-rate controlled Continue Toprol Hold Cardizem  Resume Xarelto    Abdominal aortic aneurysm CT abdomen/pelvis showed 3.3 cm infrarenal abdominal aortic aneurysm. - Recommend follow-up ultrasound every 3 years.    Prior to Admission Living Arrangement: [facility] Anticipated Discharge Location: SNF Barriers to Discharge: Medical improvement, SNF placement   Dispo: Anticipated discharge pending clinical improvement.  Gaylan Gerold, DO 04/16/2020, 7:28 AM Pager: [phone]  Please contact the on call pager after 5 pm and on weekends at [phone].

## 2020-04-17 DIAGNOSIS — C182 Malignant neoplasm of ascending colon: Secondary | ICD-10-CM | POA: Diagnosis not present

## 2020-04-17 DIAGNOSIS — A419 Sepsis, unspecified organism: Secondary | ICD-10-CM | POA: Diagnosis not present

## 2020-04-17 DIAGNOSIS — M009 Pyogenic arthritis, unspecified: Secondary | ICD-10-CM | POA: Diagnosis not present

## 2020-04-17 DIAGNOSIS — M25532 Pain in left wrist: Secondary | ICD-10-CM

## 2020-04-17 LAB — CBC
HCT: 35 % — ABNORMAL LOW (ref 36.0–46.0)
Hemoglobin: 11.2 g/dL — ABNORMAL LOW (ref 12.0–15.0)
MCH: 29.8 pg (ref 26.0–34.0)
MCHC: 32 g/dL (ref 30.0–36.0)
MCV: 93.1 fL (ref 80.0–100.0)
Platelets: 323 10*3/uL (ref 150–400)
RBC: 3.76 MIL/uL — ABNORMAL LOW (ref 3.87–5.11)
RDW: 18.2 % — ABNORMAL HIGH (ref 11.5–15.5)
WBC: 18.8 10*3/uL — ABNORMAL HIGH (ref 4.0–10.5)
nRBC: 0 % (ref 0.0–0.2)

## 2020-04-17 LAB — BASIC METABOLIC PANEL
Anion gap: 9 (ref 5–15)
BUN: 18 mg/dL (ref 8–23)
CO2: 18 mmol/L — ABNORMAL LOW (ref 22–32)
Calcium: 7.7 mg/dL — ABNORMAL LOW (ref 8.9–10.3)
Chloride: 109 mmol/L (ref 98–111)
Creatinine, Ser: 0.86 mg/dL (ref 0.44–1.00)
GFR, Estimated: >60 mL/min (ref >60)
Glucose, Bld: 95 mg/dL (ref 70–99)
Potassium: 3.6 mmol/L (ref 3.5–5.1)
Sodium: 136 mmol/L (ref 135–145)

## 2020-04-17 LAB — SARS CORONAVIRUS 2 (TAT 6-24 HRS): SARS Coronavirus 2: NEGATIVE

## 2020-04-17 LAB — GLUCOSE, CAPILLARY: Glucose-Capillary: 130 mg/dL — ABNORMAL HIGH (ref 70–99)

## 2020-04-17 MED ORDER — LINEZOLID 600 MG PO TABS
600.0000 mg | ORAL_TABLET | Freq: Two times a day (BID) | ORAL | Status: DC
  Administered 2020-04-17 – 2020-04-18 (×3): 600 mg via ORAL
  Filled 2020-04-17 (×4): qty 1

## 2020-04-17 MED ORDER — ALTEPLASE 2 MG IJ SOLR
2.0000 mg | Freq: Once | INTRAMUSCULAR | Status: AC
  Administered 2020-04-17: 2 mg
  Filled 2020-04-17: qty 2

## 2020-04-17 NOTE — Progress Notes (Signed)
Bladder scan perform showed 394cc urine. Encourage patient to void. Pt. was put on bedpan, was able to urinate 240cc urine.

## 2020-04-17 NOTE — Progress Notes (Signed)
Subjective:  Overnight, patient endorsed pruritus from rash on back and received Benadryl 25mg .  This morning, patient reports that her back is very itchy and painful. She denies any pain in her wrist, mouth pain or facial swelling.  Objective:  Vital signs in last 24 hours: Vitals:   04/16/20 1640 04/16/20 2055 04/17/20 0500 04/17/20 0842  BP: (!) 141/94 (!) 125/98 (!) 143/90 (!) 157/97  Pulse: 97 83 99 100  Resp: 20  20 16   Temp: 98.1 F (36.7 C) 98 F (36.7 C) 98.4 F (36.9 C) 99.1 F (37.3 C)  TempSrc: Oral Oral Oral   SpO2: 100% 99% 98% 97%  Weight:      Height:      On room air  Intake/Output Summary (Last 24 hours) at 04/17/2020 1554 Last data filed at 04/17/2020 0920 Gross per 24 hour  Intake 540 ml  Output 760 ml  Net -220 ml   Filed Weights   04/14/20 2135 04/15/20 0500 04/16/20 0500  Weight: 56.4 kg 56.4 kg 56.4 kg   Physical Exam HENT:     Mouth/Throat:     Mouth: Mucous membranes are dry.     Pharynx: Oropharynx is clear. No posterior oropharyngeal erythema.  Cardiovascular:     Pulses: Normal pulses.     Heart sounds: Normal heart sounds.  Pulmonary:     Effort: Pulmonary effort is normal.     Breath sounds: Normal breath sounds.  Abdominal:     General: Abdomen is flat. Bowel sounds are normal.     Palpations: Abdomen is soft.     Tenderness: There is no abdominal tenderness.  Skin:    Comments: Diffuse, confluent, erythematous maculopapular rash covering patient's trunk and upper extremities. No oral mucosal lesions or facial swelling. See media tab.    Labs in last 24 hours: CBC Latest Ref Rng & Units 04/17/2020 04/16/2020 04/15/2020  WBC 4.0 - 10.5 K/uL 18.8(H) 16.3(H) 11.4(H)  Hemoglobin 12.0 - 15.0 g/dL 11.2(L) 10.0(L) 10.6(L)  Hematocrit 36.0 - 46.0 % 35.0(L) 31.7(L) 34.3(L)  Platelets 150 - 400 K/uL 323 273 302   BMP Latest Ref Rng & Units 04/17/2020 04/16/2020 04/15/2020  Glucose 70 - 99 mg/dL 95 102(H) 130(H)  BUN 8 - 23 mg/dL 18 22 21    Creatinine 0.44 - 1.00 mg/dL 0.86 0.96 0.95  Sodium 135 - 145 mmol/L 136 137 138  Potassium 3.5 - 5.1 mmol/L 3.6 3.8 4.9  Chloride 98 - 111 mmol/L 109 110 106  CO2 22 - 32 mmol/L 18(L) 20(L) 21(L)  Calcium 8.9 - 10.3 mg/dL 7.7(L) 7.7(L) 8.1(L)   COVID - negative  Imaging in last 24 hours: No results found.  Assessment/Plan:  Principal Problem:   Pyogenic arthritis of left wrist (HCC) Active Problems:   Colon cancer (HCC)   Rheumatoid arthritis ([place])   Lymphoma ([place])   Sepsis ([place])   Fluid excess   Left wrist pain  Rebecca Hodges is a 87 year old female with past medical history significant for Waldenstrom macroglobulinemia, rheumatoid arthritis, colorectal cancer s/p hemicolectomy, breast cancer s/p radical mastectomy, HTN and atrial fibrillation who presented to [facility] on 04/11/2020 for evaluation of chest pain radiating down left arm found to have sepsis secondary to possible septic joint of the left wrist versus rheumatoid arthritis versus pseudogout with hospital course complicated by development of exanthematous drug eruption.  #Suspectedleft wrist septic arthritis Patient continues to have no growth on cultures, although cultures obtained following antibiotic administration. No fevers in past 24 hours. Morning  labs indicate a worsening leukocytosis. Patient developed significant exanthematous drug eruption while taking vancomycin and cefepime. ID consulted, appreciate recommendations. -ID following, appreciate recommendations  -Discontinue cefepime and vancomycin  -Start linezolid for 3 weeks  -CBC in 10-14 days for therapeutic monitoring of platelets  -Pain control with Tylenol, Oxycodoneand Dilaudid as needed  #Exanthematous drug eruption, active Patient noted to have development or pruritic, morbilliform rash covering her trunk and upper extremities following treatment with vancomycin and cefepime. Timeline of her drug rash most consistent with exanthematous drug  eruption, however DRESS remains a consideration. Patient does not have facial swelling or mucosal involvement. Patient would benefit from transitioning antibiotic regimen, supportive care and further laboratory monitoring. -CBC with diff and CMP to assess for eosinophilia and transaminitis to rule out DRESS -Eucerin and vaseline -Consider starting loratadine or cetirizine if pruritus is significant -Consider starting topical corticosteroids  #Rheumatoid arthritis, chronic -Continue prednisone 5mg  daily -Continue to hold methotrexate in the setting of infection  #Hypertension, chronic -Holding home losartan 100mg  daily -Holding home HCTZ 25mg  daily -Holding home diltiazem 240mg  daily  #Atrial fibrillation, chronic -Continue metoprolol 100mg  daily -Holding home diltiazem 240mg  daily -Continue home rivaroxaban 15mg  daily  Prior to Admission Living [place] Anticipated Discharge Location:SNF Barriers to Discharge:Medical improvement, SNF placement  Dispo: Anticipated dischargepending clinical improvement.  Cato Mulligan, MD 04/17/2020, 3:54 PM Pager: [phone] After 5pm on weekdays and 1pm on weekends: On Call pager [phone]

## 2020-04-17 NOTE — Plan of Care (Signed)

## 2020-04-17 NOTE — Care Management Important Message (Signed)
Important Message  Patient Details  Name: Rebecca Hodges MRN: [id number] Date of Birth: 04/24/1929   Medicare Important Message Given:  Yes     Tenelle Andreason P Aleshka Corney 04/17/2020, 2:27 PM

## 2020-04-17 NOTE — Plan of Care (Signed)
  Problem: Education: Goal: Knowledge of General Education information will improve Description Including pain rating scale, medication(s)/side effects and non-pharmacologic comfort measures Outcome: Progressing   Problem: Health Behavior/Discharge Planning: Goal: Ability to manage health-related needs will improve Outcome: Progressing   

## 2020-04-17 NOTE — Progress Notes (Signed)
[facility] for Infectious Disease  Date of Admission:  04/11/2020      Total days of antibiotics 6    Vancomycin + cefepime   Linezolid 3/01 >> current    ASSESSMENT: Rebecca Hodges is a 87 y.o. female admitted with fever/sepsis syndrome and findings concerning for possible septic arthritis of the left native wrist vs pseudogout vs RA. Fluid description with arthrocentesis was "purulent". No cell count done and culture negative for growth on several days of antibiotics prior to aspiration.  We decided to treat for 4 weeks with anti-staphylococcal and streptococcal coverage. Unfortunately she has developed a pretty severe morbilliform rash that is quite swollen and painful for her. Unclear as to whether this is related to cephalosporin or vancomycin (she has tolerated both in the past previously). Will D/C both and plan for 3 weeks of linezolid to finish out treatment. Will need CBC in 10-14 days for therapeutic monitoring of platelets.   Her rash will hopefully improve. Will need to keep an eye on organ function - would add differential to CBC in AM if this is planned as well as chemistry panel to ensure reaction limited to skin only. No lesions to mucous membranes. Will need good skin care as her rash improves and starts to flake with resolution of swelling. Recommend 50/50 Eucerin + Vaseline applications once pain improves.     PLAN: 1. Change to linezolid to complete 3 more weeks of treatment  2. Planning SNF discharge.  3. FU in 3 weeks.  4. Will check in on her rash again tomorrow.  5. Eucerine:Vaseline applications for good skin care to rash  6. Differential in AM and CMP    Principal Problem:   Septic arthritis of right wrist (HCC) Active Problems:   Colon cancer (HCC)   Rheumatoid arthritis (HCC)   Lymphoma (HCC)   Sepsis (HCC)   Fluid excess   . Chlorhexidine Gluconate Cloth  6 each Topical Daily  . folic acid  1 mg Oral Daily  . hydrocerin    Topical BID  . letrozole  2.5 mg Oral Daily  . linezolid  600 mg Oral Q12H  . metoprolol succinate  100 mg Oral Daily  . predniSONE  5 mg Oral Q breakfast  . rivaroxaban  15 mg Oral Q supper  . senna  1 tablet Oral BID  . simvastatin  10 mg Oral QHS    SUBJECTIVE: Very painful skin and low back pain. Describes it to be itchy and painful/burning.  "Wrist isn't a problem"   Review of Systems: Review of Systems  Respiratory: Negative for cough and shortness of breath.   Cardiovascular: Negative for chest pain.  Genitourinary: Negative for dysuria.  Musculoskeletal: Positive for back pain.  Skin: Positive for itching and rash.    Allergies  Allergen Reactions  . Ace Inhibitors   . Ciprofloxacin   . Penicillins     Unknown (tolerated cefepime 05/18/13 - [facility])  . Cefepime Rash    Rash developed about a week after being on cefepime. Pt was also on vancomycin unclear which caused the reaction   . Vancomycin Rash    Patient developed rash after about 7 days of therapy, was also on cefepime unclear which was the cause    OBJECTIVE: Vitals:   04/16/20 1640 04/16/20 2055 04/17/20 0500 04/17/20 0842  BP: (!) 141/94 (!) 125/98 (!) 143/90 (!) 157/97  Pulse: 97 83 99 100  Resp: 20  20  16  Temp: 98.1 F (36.7 C) 98 F (36.7 C) 98.4 F (36.9 C) 99.1 F (37.3 C)  TempSrc: Oral Oral Oral   SpO2: 100% 99% 98% 97%  Weight:      Height:       Body mass index is 19.47 kg/m.  Physical Exam Vitals reviewed.  HENT:     Nose: Nose normal.     Mouth/Throat:     Mouth: Mucous membranes are dry.     Pharynx: Oropharynx is clear. No oropharyngeal exudate.  Skin:    General: Skin is warm.     Capillary Refill: Capillary refill takes less than 2 seconds.     Findings: Rash present.  Neurological:     General: No focal deficit present.     Mental Status: She is alert. Mental status is at baseline.       Lab Results Lab Results  Component Value Date   WBC 18.8 (H) 04/17/2020    HGB 11.2 (L) 04/17/2020   HCT 35.0 (L) 04/17/2020   MCV 93.1 04/17/2020   PLT 323 04/17/2020    Lab Results  Component Value Date   CREATININE 0.86 04/17/2020   BUN 18 04/17/2020   NA 136 04/17/2020   K 3.6 04/17/2020   CL 109 04/17/2020   CO2 18 (L) 04/17/2020    Lab Results  Component Value Date   ALT 13 04/11/2020   AST 20 04/11/2020   ALKPHOS 60 04/11/2020   BILITOT 1.1 04/11/2020     Microbiology: BCx 2/23 >> no growth, final    Janene Madeira, MSN, NP-C [facility] for Infectious Disease [email or web address] Pager: [phone] Office: [phone] [place]: [phone]

## 2020-04-17 NOTE — TOC Progression Note (Signed)
Transition of Care [facility]) - Progression Note    Patient Details  Name: Rebecca Hodges MRN: [id number] Date of Birth: 1929/06/17  [facility]) CM/SW Contact  [name], LCSW Phone Number: 04/17/2020, 12:05 PM  Clinical Narrative:  Call made to [place], admissions director at [facility] regarding patient and message left. Received a VM from [facility] indicating that they can take [name] in their SNF for rehab when she is ready for discharge . CSW provided with her room number - 7016 and the number for report - [phone].    Expected Discharge Plan: [place] Barriers to Discharge: Continued Medical Work up,SNF Pending bed offer  Expected Discharge Plan and Services Expected Discharge Plan: [place] Choice: [place] arrangements for the past 2 months: Assisted Living Facility                                     Social Determinants of Health (SDOH) Interventions  No SDOH interventions requested or needed at this time  Readmission Risk Interventions No flowsheet data found.

## 2020-04-18 DIAGNOSIS — A419 Sepsis, unspecified organism: Secondary | ICD-10-CM | POA: Diagnosis not present

## 2020-04-18 DIAGNOSIS — M009 Pyogenic arthritis, unspecified: Secondary | ICD-10-CM | POA: Diagnosis not present

## 2020-04-18 DIAGNOSIS — C182 Malignant neoplasm of ascending colon: Secondary | ICD-10-CM | POA: Diagnosis not present

## 2020-04-18 DIAGNOSIS — M25532 Pain in left wrist: Secondary | ICD-10-CM | POA: Diagnosis not present

## 2020-04-18 DIAGNOSIS — R21 Rash and other nonspecific skin eruption: Secondary | ICD-10-CM | POA: Diagnosis not present

## 2020-04-18 DIAGNOSIS — I10 Essential (primary) hypertension: Secondary | ICD-10-CM | POA: Diagnosis not present

## 2020-04-18 LAB — CBC WITH DIFFERENTIAL/PLATELET
Abs Immature Granulocytes: 0.44 10*3/uL — ABNORMAL HIGH (ref 0.00–0.07)
Basophils Absolute: 0.1 10*3/uL (ref 0.0–0.1)
Basophils Relative: 0 %
Eosinophils Absolute: 0.8 10*3/uL — ABNORMAL HIGH (ref 0.0–0.5)
Eosinophils Relative: 4 %
HCT: 36.5 % (ref 36.0–46.0)
Hemoglobin: 11.7 g/dL — ABNORMAL LOW (ref 12.0–15.0)
Immature Granulocytes: 2 %
Lymphocytes Relative: 5 %
Lymphs Abs: 1.1 10*3/uL (ref 0.7–4.0)
MCH: 29.8 pg (ref 26.0–34.0)
MCHC: 32.1 g/dL (ref 30.0–36.0)
MCV: 93.1 fL (ref 80.0–100.0)
Monocytes Absolute: 1.3 10*3/uL — ABNORMAL HIGH (ref 0.1–1.0)
Monocytes Relative: 6 %
Neutro Abs: 17.7 10*3/uL — ABNORMAL HIGH (ref 1.7–7.7)
Neutrophils Relative %: 83 %
Platelets: 343 10*3/uL (ref 150–400)
RBC: 3.92 MIL/uL (ref 3.87–5.11)
RDW: 18.7 % — ABNORMAL HIGH (ref 11.5–15.5)
WBC: 21.5 10*3/uL — ABNORMAL HIGH (ref 4.0–10.5)
nRBC: 0 % (ref 0.0–0.2)

## 2020-04-18 LAB — COMPREHENSIVE METABOLIC PANEL
ALT: 13 U/L (ref 0–44)
AST: 16 U/L (ref 15–41)
Albumin: 2.3 g/dL — ABNORMAL LOW (ref 3.5–5.0)
Alkaline Phosphatase: 61 U/L (ref 38–126)
Anion gap: 9 (ref 5–15)
BUN: 20 mg/dL (ref 8–23)
CO2: 17 mmol/L — ABNORMAL LOW (ref 22–32)
Calcium: 7.4 mg/dL — ABNORMAL LOW (ref 8.9–10.3)
Chloride: 111 mmol/L (ref 98–111)
Creatinine, Ser: 0.94 mg/dL (ref 0.44–1.00)
GFR, Estimated: 58 mL/min — ABNORMAL LOW (ref >60)
Glucose, Bld: 90 mg/dL (ref 70–99)
Potassium: 3.9 mmol/L (ref 3.5–5.1)
Sodium: 137 mmol/L (ref 135–145)
Total Bilirubin: 0.8 mg/dL (ref 0.3–1.2)
Total Protein: 5 g/dL — ABNORMAL LOW (ref 6.5–8.1)

## 2020-04-18 LAB — ANAEROBIC CULTURE W GRAM STAIN

## 2020-04-18 LAB — SARS CORONAVIRUS 2 (TAT 6-24 HRS): SARS Coronavirus 2: NEGATIVE

## 2020-04-18 MED ORDER — HYDROCERIN EX CREA: 1.0000 "application " | TOPICAL_CREAM | Freq: Two times a day (BID) | CUTANEOUS | 0 refills | Status: AC

## 2020-04-18 MED ORDER — LORATADINE 10 MG PO TABS: 10.0000 mg | ORAL_TABLET | Freq: Every day | ORAL | Status: AC

## 2020-04-18 MED ORDER — LORATADINE 10 MG PO TABS
10.0000 mg | ORAL_TABLET | Freq: Every day | ORAL | Status: DC
  Administered 2020-04-18: 10 mg via ORAL
  Filled 2020-04-18: qty 1

## 2020-04-18 MED ORDER — OXYCODONE HCL 5 MG PO TABS: 5.0000 mg | ORAL_TABLET | Freq: Three times a day (TID) | ORAL | 0 refills | Status: DC

## 2020-04-18 MED ORDER — HEPARIN SOD (PORK) LOCK FLUSH 100 UNIT/ML IV SOLN
500.0000 [IU] | INTRAVENOUS | Status: AC | PRN
  Administered 2020-04-18: 500 [IU]
  Filled 2020-04-18: qty 5

## 2020-04-18 MED ORDER — LINEZOLID 600 MG PO TABS: 600.0000 mg | ORAL_TABLET | Freq: Two times a day (BID) | ORAL | 0 refills | Status: AC

## 2020-04-18 MED ORDER — RIVAROXABAN 15 MG PO TABS: 15.0000 mg | ORAL_TABLET | Freq: Every day | ORAL | Status: AC

## 2020-04-18 MED ORDER — OXYCODONE HCL 5 MG PO TABS: 5.0000 mg | ORAL_TABLET | Freq: Three times a day (TID) | ORAL | 0 refills | Status: AC

## 2020-04-18 NOTE — Progress Notes (Signed)
Subjective:  Overnight, no acute events.  This morning, patient reports that her left wrist hurts and her skin is itchy. She reports that her mouth is dry but not painful.  Objective:  Vital signs in last 24 hours: Vitals:   04/17/20 1719 04/17/20 2012 04/18/20 0449 04/18/20 0919  BP: 113/72 (!) 142/82 (!) 142/112 118/77  Pulse: 81 77 91 96  Resp: 18 20 15 18   Temp: 98.6 F (37 C) 98.5 F (36.9 C) 98.9 F (37.2 C) 98.3 F (36.8 C)  TempSrc:  Oral Oral   SpO2: 100% 100% 100% 100%  Weight:      Height:      On room air  Intake/Output Summary (Last 24 hours) at 04/18/2020 1136 Last data filed at 04/18/2020 1100 Gross per 24 hour  Intake 580 ml  Output --  Net 580 ml   Filed Weights   04/14/20 2135 04/15/20 0500 04/16/20 0500  Weight: 56.4 kg 56.4 kg 56.4 kg   Physical Exam Constitutional:      General: She is not in acute distress. HENT:     Head:     Comments: No facial swelling    Mouth/Throat:     Comments: No mucosal lesions Pulmonary:     Effort: Pulmonary effort is normal. No respiratory distress.  Skin:    Comments: Confluent faint, raised erythema of the back. Faint maculopapular rash of the proximal upper extremities.  Neurological:     General: No focal deficit present.     Mental Status: Mental status is at baseline.    Labs in last 24 hours: CBC Latest Ref Rng & Units 04/18/2020 04/17/2020 04/16/2020  WBC 4.0 - 10.5 K/uL 21.5(H) 18.8(H) 16.3(H)  Hemoglobin 12.0 - 15.0 g/dL 11.7(L) 11.2(L) 10.0(L)  Hematocrit 36.0 - 46.0 % 36.5 35.0(L) 31.7(L)  Platelets 150 - 400 K/uL 343 323 273  Neutro Abs 17.7 Monocytes Absolute 1.3 Eosinophils 0.8 Abs Immature Granulocytes 0.44  CMP Latest Ref Rng & Units 04/18/2020 04/17/2020 04/16/2020  Glucose 70 - 99 mg/dL 90 95 102(H)  BUN 8 - 23 mg/dL 20 18 22   Creatinine 0.44 - 1.00 mg/dL 0.94 0.86 0.96  Sodium 135 - 145 mmol/L 137 136 137  Potassium 3.5 - 5.1 mmol/L 3.9 3.6 3.8  Chloride 98 - 111 mmol/L 111 109 110   CO2 22 - 32 mmol/L 17(L) 18(L) 20(L)  Calcium 8.9 - 10.3 mg/dL 7.4(L) 7.7(L) 7.7(L)  Total Protein 6.5 - 8.1 g/dL 5.0(L) - -  Total Bilirubin 0.3 - 1.2 mg/dL 0.8 - -  Alkaline Phos 38 - 126 U/L 61 - -  AST 15 - 41 U/L 16 - -  ALT 0 - 44 U/L 13 - -   Imaging in last 24 hours: No results found.  Assessment/Plan:  Principal Problem:   Pyogenic arthritis of left wrist (HCC) Active Problems:   Colon cancer (HCC)   Rheumatoid arthritis ([place])   Lymphoma ([place])   Sepsis ([place])   Fluid excess   Left wrist pain  Rebecca Hodges is a 87 year old female with past medical history significant for rheumatoid arthritis, colorectal cancer s/p hemicolectomy, breast cancer s/p radical mastectomy, HTN and atrial fibrillation who presented to [facility] on 04/11/2020 for evaluation of chest pain radiating down left arm found to have sepsis secondary to possible septic joint of the left wrist versus rheumatoid arthritis flare versus pseudogout with hospital course complicated by development of exanthematous drug eruption secondary to vancomycin versus cefepime.  #Suspectedleft wrist septic arthritis No  growth on cultures. No fevers in past 24 hours. Morning labs indicate a worsening leukocytosis with neutrophilia although patient taking home prednisone. Patient transitioned to linezolid yesterday. ID consulted, appreciate recommendations. -ID following, appreciate recommendations  -Continue linezolid 600mg  twice daily for 3 weeks  -CBC in 10-14 days (March 11th-15th) for therapeutic monitoring of platelets  -Pain control with Tylenol and Oxycodone as needed  #Exanthematous drug eruption, active Patient does have eosinophilia on CBC with Diff, however no fever, abnormal lymphocytes, transaminitis or systemic symptoms to suggest DRESS. Patient's morbilliform rash should improve over next few weeks with supportive care. -Continue Eucerin twice daily -Second generation H1 antagonist for  pruritus  #Rheumatoid arthritis, chronic -Continue prednisone 5mg  daily -Continue to hold methotrexate in the setting of infection  #Hypertension, chronic Blood pressure well controlled on metoprolol alone. -Holding home losartan 100mg  daily -Holding home HCTZ 25mg  daily -Holding home diltiazem 240mg  daily  #Atrial fibrillation, chronic -Continue metoprolol 100mg  daily -Continue home rivaroxaban 15mg  daily -Holding home diltiazem 240mg  daily  Cato Mulligan, MD 04/18/2020, 11:36 AM Pager: [phone] After 5pm on weekdays and 1pm on weekends: On Call pager [phone]

## 2020-04-18 NOTE — Discharge Summary (Addendum)
Name: Rebecca Hodges MRN: [id number] DOB: 1929/07/20 87 y.o. PCP: Pcp, No  Date of Admission: 04/11/2020  9:41 AM Date of Discharge: 04/18/2020 Attending Physician: Axel Filler, *  Discharge Diagnosis: 1. Principal Problem:   Pyogenic arthritis of left wrist (HCC) Active Problems:   Colon cancer (HCC)   Rheumatoid arthritis (HCC)   Lymphoma (HCC)   Sepsis ([place])   Fluid excess   Left wrist pain  Discharge Medications: Allergies as of 04/18/2020       Reactions   Ace Inhibitors    Ciprofloxacin    Penicillins    Unknown (tolerated cefepime 05/18/13 - Thuy)   Cefepime Rash   Rash developed about a week after being on cefepime. Pt was also on vancomycin unclear which caused the reaction    Vancomycin Rash   Patient developed rash after about 7 days of therapy, was also on cefepime unclear which was the cause        Medication List     STOP taking these medications    clobetasol cream 0.05 % Commonly known as: TEMOVATE   diltiazem 240 MG 24 hr capsule Commonly known as: CARDIZEM CD   hydrochlorothiazide 25 MG tablet Commonly known as: HYDRODIURIL   [place] 5 % Ptch Generic drug: Menthol   losartan 100 MG tablet Commonly known as: COZAAR   methotrexate 2.5 MG tablet   potassium chloride 10 MEQ tablet Commonly known as: KLOR-CON   Potassium Gluconate 550 MG Tabs   traMADol 50 MG tablet Commonly known as: ULTRAM       TAKE these medications    calcium carbonate 600 MG Tabs tablet Commonly known as: OS-CAL Take 600 mg by mouth daily with breakfast.   docusate sodium 100 MG capsule Commonly known as: COLACE Take 100 mg by mouth daily.   folic acid 1 MG tablet Commonly known as: FOLVITE Take 1 mg by mouth daily.   hydrocerin Crea Apply 1 application topically 2 (two) times daily.   letrozole 2.5 MG tablet Commonly known as: FEMARA Take 2.5 mg by mouth daily.   linezolid 600 MG tablet Commonly known as: ZYVOX Take 1 tablet  (600 mg total) by mouth 2 (two) times daily for 20 days. Continue through 05/08/20   loratadine 10 MG tablet Commonly known as: CLARITIN Take 1 tablet (10 mg total) by mouth daily.   metoprolol succinate 100 MG 24 hr tablet Commonly known as: TOPROL-XL Take 100 mg by mouth daily.   oxyCODONE 5 MG immediate release tablet Commonly known as: Oxy IR/ROXICODONE Take 1 tablet (5 mg total) by mouth every 8 (eight) hours for 5 days.   predniSONE 5 MG tablet Commonly known as: DELTASONE Take 5 mg by mouth daily with breakfast.   Prolia 60 MG/ML Sosy injection Generic drug: denosumab Inject 60 mg into the skin every 6 (six) months.   Rivaroxaban 15 MG Tabs tablet Commonly known as: XARELTO Take 1 tablet (15 mg total) by mouth daily with supper. What changed:  medication strength how much to take when to take this   simvastatin 10 MG tablet Commonly known as: ZOCOR Take 10 mg by mouth at bedtime.   Vitamin D 50 MCG (2000 UT) tablet Take 2,000 Units by mouth daily.       Disposition and follow-up:   Rebecca Hodges was discharged from [facility] in Stable condition.  At the hospital follow up visit please address:  1.  Suspected left wrist septic arthritis: Patient presented  with sepsis in setting of left wrist pain concerning for left wrist septic arthritis. Patient started on IV antibiotics and transitioned to linezolid day prior to discharge. Please assess patient's recovery from this infection, pain control and adherence to antibiotics. Patient has scheduled follow-up with ID.  Drug rash: Patient developed extensive morbilliform rash five days following initiation of vancomycin and cefepime. IV antibiotics were discontinued and patient transitioned to linezolid. On day of discharge, patient's rash was less prominent but still itchy. Assess for gradual resolution of rash. Patient to be discharge with antihistamine and topical eucerin, consider escalation  pending clinical evaluation and need for further workup including biopsy.  Right eye subconjunctival hemorrhage: Patient noted to have concern for corneal abrasion of her right eye from scratching day prior to discharge. Patient has established ophthalmologist who she has not seen recently for macular degeneration. Encouraged patient's family to contact ophthalmologist to seek evaluation in 1-2 days. Ensure follow-up and resolution.  HTN: Patient's blood pressure well controlled on metoprolol alone with holding of her home diltiazem, HCTZ, losartan. Assess appropriateness of this regimen.  2.  Labs / imaging needed at time of follow-up: CBC in 9-13 days (assess platelets)  3.  Pending labs/ test needing follow-up: None  Follow-up Appointments:  Follow-up Information     [facility] Go in 1 day(s).   Specialty: Skilled Nursing Facility Contact information: [address] [place] [address] [phone]         [facility] for Infectious Disease Follow up on 04/30/2020.   Specialty: Infectious Diseases Why: 9:45 am appointment with Colletta Maryland, NP  Contact information: [place], [place] [id number] [place] [address] [phone]        Ardeth Sportsman., MD. Schedule an appointment as soon as possible for a visit in 1 week(s).   Specialty: Hematology and Oncology Contact information: [address] [place] [address] [phone]         Javier Glazier, MD. Schedule an appointment as soon as possible for a visit in 1 week(s).   Specialty: Internal Medicine Contact information: [place] [address] [place] Hospital Course by problem list:  #Suspected left wrist septic arthritis Patient presented with sepsis secondary to probable left wrist septic arthritis. Patient underwent MRI however image limited due to motion. Patient underwent ultrasound guided  aspiration which revealed extracellular crystals consistent with pseudogout. No organisms present on culture, however patient on antibiotics prior to collected. ID consulted and recommended continuation of antibiotics with transition to linezolid upon discharge following onset of rash. Patient to follow-up with ID in outpatient setting.   #Exanthematous drug eruption Patient had gradual development of morbilliform rash five days following starting vancomycin and cefepime. Patient started on supportive treatment with cessation of IV vancomycin and cefepime. Patient transitioned to linezolid with appropriate tolerance. Patient's rash became less prominent prior to discharge. Further workup for systemic involvement was largely unremarkable. Patient discharged with loratadine and eucerin with plan to escalate treatment if needed.   #Rheumatoid arthritis Patient with severe RA continued on prednisone during hospitalization. Methotrexate held during hospitalization.   #Hypertension Blood pressure well controlled on metoprolol alone with holding of her home losartan, HCTZ, diltiazem.    #Atrial fibrillation Patient's heart rate well controlled on metoprolol alone with holding of home diltiazem. Rivaroxaban dose decreased from 20mg  daily to 15mg  daily.  Pertinent Labs, Studies,  and Procedures:  CBC Latest Ref Rng & Units 04/18/2020 04/17/2020 04/16/2020  WBC 4.0 - 10.5 K/uL 21.5(H) 18.8(H) 16.3(H)  Hemoglobin 12.0 - 15.0 g/dL 11.7(L) 11.2(L) 10.0(L)  Hematocrit 36.0 - 46.0 % 36.5 35.0(L) 31.7(L)  Platelets 150 - 400 K/uL 343 323 273   CMP Latest Ref Rng & Units 04/18/2020 04/17/2020 04/16/2020  Glucose 70 - 99 mg/dL 90 95 102(H)  BUN 8 - 23 mg/dL 20 18 22   Creatinine 0.44 - 1.00 mg/dL 0.94 0.86 0.96  Sodium 135 - 145 mmol/L 137 136 137  Potassium 3.5 - 5.1 mmol/L 3.9 3.6 3.8  Chloride 98 - 111 mmol/L 111 109 110  CO2 22 - 32 mmol/L 17(L) 18(L) 20(L)  Calcium 8.9 - 10.3 mg/dL 7.4(L) 7.7(L) 7.7(L)  Total  Protein 6.5 - 8.1 g/dL 5.0(L) - -  Total Bilirubin 0.3 - 1.2 mg/dL 0.8 - -  Alkaline Phos 38 - 126 U/L 61 - -  AST 15 - 41 U/L 16 - -  ALT 0 - 44 U/L 13 - -  DG Chest 2 View  Result Date: 04/11/2020 CLINICAL DATA:  Chest pain. EXAM: CHEST - 2 VIEW COMPARISON:  Single-view of the chest 04/03/2019 and 09/21/2013. FINDINGS: The lungs are emphysematous. Subsegmental atelectasis is seen in the left base. Right lung is clear. No pneumothorax or pleural effusion. Cardiomegaly and aortic atherosclerosis. Port-A-Cath is in place. No acute or focal bony abnormality. IMPRESSION: Subsegmental atelectasis left lung base. Cardiomegaly. Aortic Atherosclerosis (ICD10-I70.0) and Emphysema (ICD10-J43.9). Electronically Signed   By: Inge Rise M.D.   On: 04/11/2020 11:27   DG Wrist Complete Left  Result Date: 04/11/2020 CLINICAL DATA:  Left wrist pain and swelling after a fall. EXAM: LEFT WRIST - COMPLETE 3+ VIEW COMPARISON:  None. FINDINGS: No acute fracture or dislocation. Chronically widened scapholunate interval with proximal capitate migration. Diffuse radiocarpal, midcarpal, and CMC joint space narrowing with scattered erosions of the carpal bones, distal radius, and distal ulna. Increased mineralization within the distal radioulnar joint and along the radial aspect of the radioscaphoid joint. Osteopenia. Diffuse soft tissue swelling about the wrist. IMPRESSION: 1. No acute osseous abnormality. 2. Sequelae of advanced rheumatoid arthritis.  SLAC wrist. Electronically Signed   By: Titus Dubin M.D.   On: 04/11/2020 11:47   CT ABDOMEN PELVIS W CONTRAST  Result Date: 04/14/2020 CLINICAL DATA:  Acute generalized abdominal pain. EXAM: CT ABDOMEN AND PELVIS WITH CONTRAST TECHNIQUE: Multidetector CT imaging of the abdomen and pelvis was performed using the standard protocol following bolus administration of intravenous contrast. CONTRAST:  16mL OMNIPAQUE IOHEXOL 300 MG/ML  SOLN COMPARISON:  May 13, 2016.  FINDINGS: Lower chest: Small bilateral pleural effusions are noted with adjacent subsegmental atelectasis. Hepatobiliary: No gallstones or biliary dilatation is noted. Stable left hepatic cysts are noted. Pancreas: Unremarkable. No pancreatic ductal dilatation or surrounding inflammatory changes. Spleen: Normal in size without focal abnormality. Adrenals/Urinary Tract: Adrenal glands appear normal. Bilateral renal cysts are noted. No hydronephrosis or renal obstruction is noted. No renal or ureteral calculi are noted. Moderate urinary bladder distention is noted. Stomach/Bowel: Stomach appears normal. There is no evidence of bowel obstruction or inflammation. Status post appendectomy. Vascular/Lymphatic: Atherosclerosis of thoracic aorta is noted. 3.3 cm infrarenal abdominal aortic aneurysm is noted. No adenopathy is noted. Reproductive: Status post hysterectomy. No adnexal masses. Other: No abdominal wall hernia or abnormality. No abdominopelvic ascites. Musculoskeletal: No acute or significant osseous findings. IMPRESSION: 1. Small bilateral pleural effusions are noted with adjacent subsegmental atelectasis. 2. Stable left hepatic  and bilateral renal cysts. 3. 3.3 cm infrarenal abdominal aortic aneurysm. Recommend follow-up ultrasound every 3 years. This recommendation follows ACR consensus guidelines: White Paper of the ACR Incidental Findings Committee II on Vascular Findings. J Am Coll Radiol 2013; 10:789-794. 4. Moderate urinary bladder distention is noted. 5. No other significant abnormality seen in the abdomen or pelvis. 6. Aortic atherosclerosis. Aortic Atherosclerosis (ICD10-I70.0). Electronically Signed   By: Marijo Conception M.D.   On: 04/14/2020 18:25   MR WRIST LEFT WO CONTRAST  Result Date: 04/12/2020 CLINICAL DATA:  Sepsis and delirium. History of rheumatoid arthritis with red, swollen, and warm left wrist. EXAM: MR OF THE LEFT WRIST WITHOUT CONTRAST TECHNIQUE: Multiplanar, multisequence MR  imaging of the left wrist was performed. No intravenous contrast was administered. COMPARISON:  Left wrist x-rays from yesterday. FINDINGS: Only three coronal sequences were obtained as the examination was terminated early due to inability for the patient to cooperate with the exam. There is significant motion artifact limiting evaluation. Sequelae of advanced rheumatoid arthritis with diffuse radiocarpal, midcarpal, and CMC joint space narrowing. Innumerable small erosions involving the distal radius, distal ulna, carpal bones, and bases of the metacarpals. Large distal radioulnar joint effusion. Diffuse soft tissue swelling. IMPRESSION: 1. Significantly limited, incomplete study due to patient's inability to cooperate with the exam. 2. Large distal radioulnar joint effusion is technically nonspecific, but increased mineralization within the joint seen on x-ray favors acute CPPD arthropathy. 3. Sequelae of advanced rheumatoid arthritis. Electronically Signed   By: Titus Dubin M.D.   On: 04/12/2020 14:46   DG Shoulder Left  Result Date: 04/11/2020 CLINICAL DATA:  Fall with left shoulder pain. EXAM: LEFT SHOULDER - 2+ VIEW COMPARISON:  None. FINDINGS: No acute fracture or dislocation identified. Bones are osteopenic. Mild degenerative disease of the [facility] joint. Portions of an indwelling Port-A-Cath are visualized. IMPRESSION: No acute fracture identified. Electronically Signed   By: Aletta Edouard M.D.   On: 04/11/2020 11:40   DG Foot Complete Right  Result Date: 04/11/2020 CLINICAL DATA:  RIGHT foot pain no injury.  Prior surgery EXAM: RIGHT FOOT COMPLETE - 3+ VIEW COMPARISON:  None. FINDINGS: Large staple fusion of the tarsal bones. No acute osseous erosion or fracture. No soft tissue abnormality. IMPRESSION: No acute findings in the foot. Electronically Signed   By: Suzy Bouchard M.D.   On: 04/11/2020 16:25   [place] FINE NEEDLE ASP 1ST LESION  Result Date: 04/13/2020 INDICATION: 87 year old with  sepsis. History of rheumatoid arthritis with red and swollen left arm and wrist. Recent MRI demonstrated a left wrist joint effusion. EXAM: ULTRASOUND-GUIDED LEFT WRIST ASPIRATION MEDICATIONS: None ANESTHESIA/SEDATION: None COMPLICATIONS: None immediate. PROCEDURE: Informed consent was obtained for image guided wrist aspiration. Left wrist was evaluated with ultrasound. Small effusion was identified along the volar and ulnar aspect of the wrist. The skin was prepped with chlorhexidine. Sterile field was created. Skin was anesthetized using 1% lidocaine. Using ultrasound guidance, a 22 gauge spinal needle was directed into the small effusion. Approximately 1 mL of yellow purulent fluid was aspirated. Majority of the fluid was aspirated. Bandage placed over the puncture site. FINDINGS: Small effusion along the volar and ulnar aspect of the left wrist. IMPRESSION: Ultrasound-guided aspiration of the small left wrist joint effusion. Yellow purulent fluid was obtained. Fluid was sent for analysis. Electronically Signed   By: Markus Daft M.D.   On: 04/13/2020 17:30   Discharge Instructions: Discharge Instructions     Call MD for:  difficulty breathing, headache or  visual disturbances   Complete by: As directed    Call MD for:  extreme fatigue   Complete by: As directed    Call MD for:  hives   Complete by: As directed    Call MD for:  persistant dizziness or light-headedness   Complete by: As directed    Call MD for:  persistant nausea and vomiting   Complete by: As directed    Call MD for:  redness, tenderness, or signs of infection (pain, swelling, redness, odor or green/yellow discharge around incision site)   Complete by: As directed    Call MD for:  severe uncontrolled pain   Complete by: As directed    Call MD for:  temperature >100.4   Complete by: As directed    Diet - low sodium heart healthy   Complete by: As directed    Increase activity slowly   Complete by: As directed         Signed: Cato Mulligan, MD 04/18/2020, 12:47 PM   Pager: [phone]

## 2020-04-18 NOTE — TOC Transition Note (Signed)
Transition of Care [facility]) - CM/SW Discharge Note   Patient Details  Name: Rebecca Hodges MRN: [id number] Date of Birth: 06/27/1929  [facility] CM/SW Contact:  Sable Feil, LCSW Phone Number: 04/18/2020, 7:10 PM   Clinical Narrative:  Patient discharging to [facility] for [facility]. Discharge clinicals transmitted to facility and transport arranged. Daughter, Arnette Norris [phone]) contacted and advised of discharge. Patient's COVID test resulted negative.       Final next level of care: [place] [facility]) Barriers to Discharge: Barriers Resolved   Patient Goals and CMS Choice Patient states their goals for this hospitalization and ongoing recovery are:: Daughter agreeable to patient discharging to [facility] for ST rehab before returning [facility] ALF at [email or web address] Compare Post Acute Care list provided to:: Other (Comment Required) (Not needed as patient from [facility]) Choice offered to / list presented to : NA  Discharge Placement   Existing PASRR number confirmed : 04/15/20            Patient to be transferred to facility by: Non-emergency ambulance transport Name of family member notified: Daughter Arnette Norris - [phone] Patient and family notified of of transfer: 04/18/20  Discharge Plan and Services     Post Acute Care Choice: [place]                               Social Determinants of Health (SDOH) Interventions  None needed or requested at discharge   Readmission Risk Interventions No flowsheet data found.

## 2020-04-18 NOTE — Plan of Care (Signed)
  Problem: Education: Goal: Knowledge of General Education information will improve Description: Including pain rating scale, medication(s)/side effects and non-pharmacologic comfort measures Outcome: Adequate for Discharge   Problem: Health Behavior/Discharge Planning: Goal: Ability to manage health-related needs will improve Outcome: Adequate for Discharge   Problem: Clinical Measurements: Goal: Ability to maintain clinical measurements within normal limits will improve Outcome: Adequate for Discharge Goal: Will remain free from infection 04/18/2020 1855 by Dolores Hoose, RN Outcome: Adequate for Discharge 04/18/2020 0758 by Dolores Hoose, RN Outcome: Progressing Goal: Diagnostic test results will improve Outcome: Adequate for Discharge Goal: Cardiovascular complication will be avoided Outcome: Adequate for Discharge   Problem: Activity: Goal: Risk for activity intolerance will decrease 04/18/2020 1855 by Dolores Hoose, RN Outcome: Adequate for Discharge 04/18/2020 0758 by Dolores Hoose, RN Outcome: Progressing   Problem: Nutrition: Goal: Adequate nutrition will be maintained 04/18/2020 1855 by Dolores Hoose, RN Outcome: Adequate for Discharge 04/18/2020 0758 by Dolores Hoose, RN Outcome: Progressing   Problem: Coping: Goal: Level of anxiety will decrease Outcome: Adequate for Discharge   Problem: Elimination: Goal: Will not experience complications related to bowel motility Outcome: Adequate for Discharge Goal: Will not experience complications related to urinary retention Outcome: Adequate for Discharge   Problem: Pain Managment: Goal: General experience of comfort will improve Outcome: Adequate for Discharge   Problem: Safety: Goal: Ability to remain free from injury will improve Outcome: Adequate for Discharge   Problem: Skin Integrity: Goal: Risk for impaired skin integrity will decrease Outcome: Adequate for Discharge   Problem: Acute  Rehab PT Goals(only PT should resolve) Goal: Pt Will Go Supine/Side To Sit Outcome: Adequate for Discharge Goal: Pt Will Go Sit To Supine/Side Outcome: Adequate for Discharge Goal: Patient Will Transfer Sit To/From Stand Outcome: Adequate for Discharge Goal: Pt Will Transfer Bed To Chair/Chair To Bed Outcome: Adequate for Discharge Goal: Pt Will Ambulate Outcome: Adequate for Discharge

## 2020-04-18 NOTE — Progress Notes (Signed)
[facility] for Infectious Disease  Date of Admission:  04/11/2020      Total days of antibiotics 7    Linezolid 3/01 >> current   Vancomycin + cefepime     ASSESSMENT: Rebecca Hodges is a 87 y.o. female admitted with fever/sepsis syndrome and findings concerning for possible septic arthritis of the left native wrist vs pseudogout vs RA. Fluid description with arthrocentesis was "purulent". No cell count done and culture negative for growth on several days of antibiotics prior to aspiration. We decided to treat for 4 weeks with anti-staphylococcal and streptococcal coverage to be conservative given description of fluid.   Developed a morbilliform rash that was quite swollen and painful for her. This does appear to be less swollen and painful today but continues to have bad pruritis. Atarax being started by primary team. Unclear if it was cephalosporin or vancomycin allergy, both have been discontinued and added to her allergy list. Organ function normal.  Continue linezolid x 3 weeks. Plan for CBC to assess platelets in 10-14 days.    Continue 50/50 Eucerin + Vaseline applications.     PLAN: 1. Continue linezolid x 3 weeks.  2. FU arranged with me in ID clinic 04/30/2020@ 9:45 am  3. Skin care with antihistamines and topicals    Principal Problem:   Pyogenic arthritis of left wrist (HCC) Active Problems:   Colon cancer (HCC)   Rheumatoid arthritis (HCC)   Lymphoma (HCC)   Sepsis (HCC)   Fluid excess   Left wrist pain   . Chlorhexidine Gluconate Cloth  6 each Topical Daily  . folic acid  1 mg Oral Daily  . hydrocerin   Topical BID  . letrozole  2.5 mg Oral Daily  . linezolid  600 mg Oral Q12H  . metoprolol succinate  100 mg Oral Daily  . predniSONE  5 mg Oral Q breakfast  . rivaroxaban  15 mg Oral Q supper  . senna  1 tablet Oral BID  . simvastatin  10 mg Oral QHS    SUBJECTIVE: Very painful skin and low back pain. Describes it to be itchy and  painful/burning.  "Wrist isn't a problem"   Review of Systems: Review of Systems  Respiratory: Negative for cough and shortness of breath.   Cardiovascular: Negative for chest pain.  Genitourinary: Negative for dysuria.  Musculoskeletal: Positive for back pain.  Skin: Positive for itching and rash.    Allergies  Allergen Reactions  . Ace Inhibitors   . Ciprofloxacin   . Penicillins     Unknown (tolerated cefepime 05/18/13 - Thuy)  . Cefepime Rash    Rash developed about a week after being on cefepime. Pt was also on vancomycin unclear which caused the reaction   . Vancomycin Rash    Patient developed rash after about 7 days of therapy, was also on cefepime unclear which was the cause    OBJECTIVE: Vitals:   04/17/20 1719 04/17/20 2012 04/18/20 0449 04/18/20 0919  BP: 113/72 (!) 142/82 (!) 142/112 118/77  Pulse: 81 77 91 96  Resp: 18 20 15 18   Temp: 98.6 F (37 C) 98.5 F (36.9 C) 98.9 F (37.2 C) 98.3 F (36.8 C)  TempSrc:  Oral Oral   SpO2: 100% 100% 100% 100%  Weight:      Height:       Body mass index is 19.47 kg/m.  Physical Exam Vitals reviewed.  HENT:     Nose: Nose  normal.     Mouth/Throat:     Mouth: Mucous membranes are dry.     Pharynx: Oropharynx is clear. No oropharyngeal exudate.  Skin:    General: Skin is warm.     Capillary Refill: Capillary refill takes less than 2 seconds.     Findings: Rash present.  Neurological:     General: No focal deficit present.     Mental Status: She is alert. Mental status is at baseline.       Lab Results Lab Results  Component Value Date   WBC 21.5 (H) 04/18/2020   HGB 11.7 (L) 04/18/2020   HCT 36.5 04/18/2020   MCV 93.1 04/18/2020   PLT 343 04/18/2020    Lab Results  Component Value Date   CREATININE 0.94 04/18/2020   BUN 20 04/18/2020   NA 137 04/18/2020   K 3.9 04/18/2020   CL 111 04/18/2020   CO2 17 (L) 04/18/2020    Lab Results  Component Value Date   ALT 13 04/18/2020   AST 16  04/18/2020   ALKPHOS 61 04/18/2020   BILITOT 0.8 04/18/2020     Microbiology: BCx 2/23 >> no growth, final    Janene Madeira, MSN, NP-C [facility] for Infectious Disease [email or web address] Pager: [phone] Office: [phone] [place]: [phone]

## 2020-04-18 NOTE — Progress Notes (Signed)
DISCHARGE NOTE SNF   Rebecca Hodges to be discharged Rebecca Hodges per MD order. Patient verbalized understanding.  Skin clean, dry and intact without evidence of skin break down, no evidence of skin tears noted.Patient with Left chest Porta cath, deaccessed. Not complaints noted.  Discharge packet assembled. An After Visit Summary (AVS) was printed and given to the EMS personnel. Patient escorted via stretcher and discharged to [facility] via ambulance. Report called to accepting facility by dayshift RN Ren; all questions and concerns addressed.   Babs Sciara, RN

## 2020-04-18 NOTE — TOC Progression Note (Deleted)
Transition of Care (TOC) - Progression Note  *Patient will discharge to [facility] on Thursday, 3/3   Patient Details  Name: Rebecca Hodges MRN: [id number] Date of Birth: Dec 15, 1929  Transition of Care [facility]) CM/SW Contact  [name], LCSW Phone Number: 04/18/2020, 6:27 PM  Clinical Narrative:  Patient was set to discharge to [facility] skilled nursing facility for ST rehab, however as of 6:27 pm, her COVID test has not resulted. Facility willing to take her as late as 9:15 pm, however not sure when test will result and unsure of transport time to facility if it does result this evening before 9 am. Nurse at [facility] contacted and updated that patient will discharge tomorrow. Paged resident to provide update.  [facility] contacted and transport for this evening cancelled and transport scheduled for 3/3 with a 10 am pick-up.     Expected Discharge Plan: [place] Barriers to Discharge: Continued Medical Work up,SNF Pending bed offer  Expected Discharge Plan and Services Expected Discharge Plan: [place] Choice: [place] arrangements for the past 2 months: [place] Expected Discharge Date: 04/18/20                                   Social Determinants of Health (SDOH) Interventions    Readmission Risk Interventions No flowsheet data found.

## 2020-04-18 NOTE — Plan of Care (Signed)
°  Problem: Clinical Measurements: °Goal: Will remain free from infection °Outcome: Progressing °  °Problem: Activity: °Goal: Risk for activity intolerance will decrease °Outcome: Progressing °  °Problem: Nutrition: °Goal: Adequate nutrition will be maintained °Outcome: Progressing °  °

## 2020-04-26 NOTE — Progress Notes (Signed)
Patient: Rebecca Hodges  DOB: [birth year] MRN: [id number] PCP: Pcp, No    Subjective:   CC: SNF called yesterday to report increased pain in the right wrist. Requesting to be seen sooner than scheduled appt.     HPI:  Rebecca Hodges is a 87 y.o. female admitted to [facility] recently with fever/sepsis syndrome and findings concerning for possible septic arthritis of the left native wrist vs pseudogout vs RA. Fluid description with arthrocentesis was "purulent". No cell count done and culture negative for growth on several days of antibiotics prior to aspiration. + crystals. Dr. Caralyn Guile with surgery team felt more c/w pseudogout --> aspirate culture negative (on antibiotics already, however), extracellular calcium pyrophosphate crystals.  Primary service concerned for secondary infection with description of purulence and concern for no other cause of patients fever - We decided to treat for 4 weeks with anti-staphylococcal and streptococcal coverage to be conservative given description of fluid.  Developed a pruritic macular rash on Vancomycin + Cefepime. This was discontinued and the patient was given linezolid to complete 4 total weeks of therapy.   Discharged to [facility] on 3/2.  Her daughter, Rebecca Hodges is here today. Rebecca Hodges does not remember me, nor her full hospitalization recently. Does not recall she had a severe rash.  States that she was doing well until her physical therapy appointment when the team had her "pulling up" with her hands. Since that time the right hand has gotten progressively more painful and swollen and red. No fevers or chills to declare. She overall does not feel poorly, just in a significant amount of pain. Her left hand (which we were treating her for possible septic arthritis) is unremarkable - no pain or swelling. Baseline from what her daughter's opinion is.   There was a OV with Dr. Antony Salmon on 3/4 that revealed she was doing well and with  improvement in rash and wrist pain at that time. Her regular dose of methotrexate has been held due to concern over infection. Prednisone 5 mg QD was continued.    Review of Systems  Constitutional: Negative for chills, fever and malaise/fatigue.  Eyes: Negative for blurred vision and photophobia.  Respiratory: Negative for cough and sputum production.   Cardiovascular: Negative for chest pain.  Gastrointestinal: Negative for diarrhea, nausea and vomiting.  Genitourinary: Negative for dysuria.  Musculoskeletal: Positive for joint pain (R hand/wrist ).  Skin: Negative for rash.  Neurological: Negative for headaches.       Past Medical History:  Diagnosis Date  . Acute hyponatremia 04/2016  . 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 Hypertension   . Hypokalemia 04/2016  . Lymphoma ([place])   . Osteoporosis   . Rheumatoid arthritis ([place])   . Waldenstrom macroglobulinemia ([place])     Outpatient Medications Prior to Visit  Medication Sig Dispense Refill  . calcium carbonate (OS-CAL) 600 MG TABS tablet Take 600 mg by mouth daily with breakfast.    . Cholecalciferol (VITAMIN D) 50 MCG (2000 UT) tablet Take 2,000 Units by mouth daily.    Marland Kitchen docusate sodium (COLACE) 100 MG capsule Take 100 mg by mouth daily.    . folic acid (FOLVITE) 1 MG tablet Take 1 mg by mouth daily.    . hydrocerin (EUCERIN) CREA Apply 1 application topically 2 (two) times daily. 454 g 0  . letrozole (FEMARA) 2.5 MG tablet Take 2.5 mg  by mouth daily.    Marland Kitchen linezolid (ZYVOX) 600 MG tablet Take 1 tablet (600 mg total) by mouth 2 (two) times daily for 20 days. Continue through 05/08/20 40 tablet 0  . loratadine (CLARITIN) 10 MG tablet Take 1 tablet (10 mg total) by mouth daily. 30 tablet   . metoprolol succinate (TOPROL-XL) 100 MG 24 hr tablet Take 100 mg by mouth daily.     . predniSONE (DELTASONE) 5 MG tablet Take 5 mg by mouth daily  with breakfast.     . PROLIA 60 MG/ML SOSY injection Inject 60 mg into the skin every 6 (six) months.    . Rivaroxaban (XARELTO) 15 MG TABS tablet Take 1 tablet (15 mg total) by mouth daily with supper. 42 tablet   . simvastatin (ZOCOR) 10 MG tablet Take 10 mg by mouth at bedtime.      No facility-administered medications prior to visit.     Allergies  Allergen Reactions  . Ace Inhibitors   . Ciprofloxacin   . Penicillins     Unknown (tolerated cefepime 05/18/13 - [place])  . Cefepime Rash    Rash developed about a week after being on cefepime. Pt was also on vancomycin unclear which caused the reaction   . Vancomycin Rash    Patient developed rash after about 7 days of therapy, was also on cefepime unclear which was the cause    Social History   Tobacco Use  . Smoking status: Former Research scientist (life sciences)  . Smokeless tobacco: Never Used  Substance Use Topics  . Alcohol use: No  . Drug use: No    Family History  Problem Relation Age of Onset  . Arthritis/Rheumatoid Mother     Objective:   Vitals:   04/27/20 0933  BP: (!) 146/75  Hodges: 71   There is no height or weight on file to calculate BMI.  Physical Exam Vitals reviewed.  Constitutional:      General: She is not in acute distress.    Comments: Seated in wheelchair today with her daughter. She in general appears to be in a lot of pain regarding her right hand.   HENT:     Mouth/Throat:     Mouth: Mucous membranes are moist.     Pharynx: Oropharynx is clear.  Eyes:     General: No scleral icterus.    Comments: R eye scleral hemorrhage resolved.   Cardiovascular:     Rate and Rhythm: Normal rate and regular rhythm.     Pulses: Normal pulses.  Pulmonary:     Effort: Pulmonary effort is normal.     Breath sounds: Normal breath sounds.  Musculoskeletal:     Comments: L wrist is baseline in deformed appearance, non-tender, non-swollen and normal color/temperature.   R wrist normal in appearance/color and temp but erythema,  swelling, warmth and tenderness at the metacarpophalangeal joints. Intolerant of any touch. Also describes the underside of her wrist hurts also. This is normal color/skin appearance.   Skin:    General: Skin is warm and dry.     Capillary Refill: Capillary refill takes less than 2 seconds.     Comments: Normothermic, non-flushed. R wrist is erythematous and swollen/tender.   Neurological:     Mental Status: She is alert. Mental status is at baseline.    R wrist     L wrist      Lab Results: Lab Results  Component Value Date   WBC 21.5 (H) 04/18/2020   HGB 11.7 (L) 04/18/2020  HCT 36.5 04/18/2020   MCV 93.1 04/18/2020   PLT 343 04/18/2020    Lab Results  Component Value Date   CREATININE 0.94 04/18/2020   BUN 20 04/18/2020   NA 137 04/18/2020   K 3.9 04/18/2020   CL 111 04/18/2020   CO2 17 (L) 04/18/2020    Lab Results  Component Value Date   ALT 13 04/18/2020   AST 16 04/18/2020   ALKPHOS 61 04/18/2020   BILITOT 0.8 04/18/2020     Assessment & Plan:   Problem List Items Addressed This Visit      Unprioritized   Right hand pain    See above plan for left wrist pain - on exam she has exquisitely tender, inflamed metacarpophalangeal joints c/w RA flare. She has a long linear incision on the right posterior wrist - will check wrist/hand xray incase there is hardware here to be cautious but wrist overall is non tender.       Rheumatoid arthritis ([place])    Discused with Rebecca Hodges and Ms. Kerkhoff - will get her back to see her rheumatologist and restart the methotrexate at previous dose.       Left wrist pain - Primary    Resolved - The wrist looks benign and at her baseline today pretty consistent with when she left the hospital.   I spent time reviewing the hospitalization with Rebecca Hodges and let her know that the hand surgeon did not feel this was infection but we did not have enough information to completely rule it out given she had fever and what was potentially  described to have been "purulent" fluid with aspirate. Pseudogout was the leading diagnosis from surgery team with aspirate c/w crystal formation.  Overall, exam was pretty under whelming for septic joint in the hospital but with her risk factors we decided to be conservative and treat on the possibility for infection.  For this reason she has had her methotrexate on hold and on linezolid. I think the focus should be getting her in to see rheumatologist to be re-stabilized on medications and resume her methotrexate at previous dose. I will stop her linezolid.    Would recommend FU with hand surgeon Caralyn Guile) should she have ongoing concerns for septic wrist. I am happy to help arrange if rheumatology feels this is needed.  Her daughter Rebecca Hodges was comfortable with this plan and completely agrees.  FU with ID PRN otherwise.          Labs reviewed from nursing home - plt OK on linezolid. WBC >16.4 c/w inflammation.   Janene Madeira, MSN, NP-C [facility] [place] Pager: [phone] Office: [phone]  04/27/20  2:38 PM

## 2020-04-27 ENCOUNTER — Encounter: Payer: Self-pay | Admitting: Infectious Diseases

## 2020-04-27 ENCOUNTER — Ambulatory Visit (INDEPENDENT_AMBULATORY_CARE_PROVIDER_SITE_OTHER): Payer: Medicare Other | Admitting: Infectious Diseases

## 2020-04-27 ENCOUNTER — Ambulatory Visit
Admission: RE | Admit: 2020-04-27 | Discharge: 2020-04-27 | Disposition: A | Discharge status: Home / Self Care | Payer: Medicare Other | Source: Ambulatory Visit | Attending: Infectious Diseases | Admitting: Infectious Diseases

## 2020-04-27 ENCOUNTER — Other Ambulatory Visit: Payer: Self-pay

## 2020-04-27 VITALS — BP 146/75 | HR 69

## 2020-04-27 DIAGNOSIS — M25532 Pain in left wrist: Secondary | ICD-10-CM | POA: Diagnosis present

## 2020-04-27 DIAGNOSIS — M25531 Pain in right wrist: Secondary | ICD-10-CM

## 2020-04-27 DIAGNOSIS — M79641 Pain in right hand: Secondary | ICD-10-CM | POA: Insufficient documentation

## 2020-04-27 DIAGNOSIS — L270 Generalized skin eruption due to drugs and medicaments taken internally: Secondary | ICD-10-CM | POA: Insufficient documentation

## 2020-04-27 DIAGNOSIS — M05732 Rheumatoid arthritis with rheumatoid factor of left wrist without organ or systems involvement: Secondary | ICD-10-CM

## 2020-04-27 DIAGNOSIS — M009 Pyogenic arthritis, unspecified: Secondary | ICD-10-CM

## 2020-04-27 DIAGNOSIS — M05731 Rheumatoid arthritis with rheumatoid factor of right wrist without organ or systems involvement: Secondary | ICD-10-CM

## 2020-04-27 NOTE — Assessment & Plan Note (Signed)
Completely resolved - unclear if it was vancomycin or cefepime. I hate we were not able to figure out more as to which the culprit was incase she needs antibiotics in the future.

## 2020-04-27 NOTE — Assessment & Plan Note (Addendum)
Resolved - The wrist looks benign and at her baseline today pretty consistent with when she left the hospital.   I spent time reviewing the hospitalization with Rebecca Hodges and let her know that the hand surgeon did not feel this was infection but we did not have enough information to completely rule it out given she had fever and what was potentially described to have been "purulent" fluid with aspirate. Pseudogout was the leading diagnosis from surgery team with aspirate c/w crystal formation.  Overall, exam was pretty under whelming for septic joint in the hospital but with her risk factors we decided to be conservative and treat on the possibility for infection.  For this reason she has had her methotrexate on hold and on linezolid. I think the focus should be getting her in to see rheumatologist to be re-stabilized on medications and resume her methotrexate at previous dose. I will stop her linezolid.    Would recommend FU with hand surgeon Caralyn Guile) should she have ongoing concerns for septic wrist. I am happy to help arrange if rheumatology feels this is needed.  Her daughter Rebecca Hodges was comfortable with this plan and completely agrees.  FU with ID PRN otherwise.

## 2020-04-27 NOTE — Assessment & Plan Note (Signed)
Discused with [name] and Ms. Storer - will get her back to see her rheumatologist and restart the methotrexate at previous dose.

## 2020-04-27 NOTE — Assessment & Plan Note (Signed)
See above plan for left wrist pain - on exam she has exquisitely tender, inflamed metacarpophalangeal joints c/w RA flare. She has a long linear incision on the right posterior wrist - will check wrist/hand xray incase there is hardware here to be cautious but wrist overall is non tender.

## 2020-04-27 NOTE — Patient Instructions (Addendum)
Please go to [facility] imaging across the hall to get your wrist and hand xray done today. I just want to check the hardware to be sure.  I suspect this is non-infectious and likely flare of her RA.   The last visit with rheumatology was with  Hermelinda Medicus, MD  Address: [address] [place], [place] [address] Phone: [phone]

## 2020-04-30 ENCOUNTER — Encounter: Payer: Self-pay | Admitting: Infectious Diseases

## 2020-04-30 ENCOUNTER — Ambulatory Visit: Payer: Medicare Other | Admitting: Infectious Diseases

## 2020-05-04 LAB — CULTURE, FUNGUS WITHOUT SMEAR: Culture: NO GROWTH

## 2021-03-21 IMAGING — DX DG SHOULDER 2+V*L*
1 series · 2 of 2 positions shown · non-contrast
Comparison: None.

CLINICAL DATA: Shoulder pain.

EXAM:
LEFT SHOULDER - 2+ VIEW

[Series 1: shoulder · 0.14mm/px · 2 of 2 slices shown]
[im 1/2]
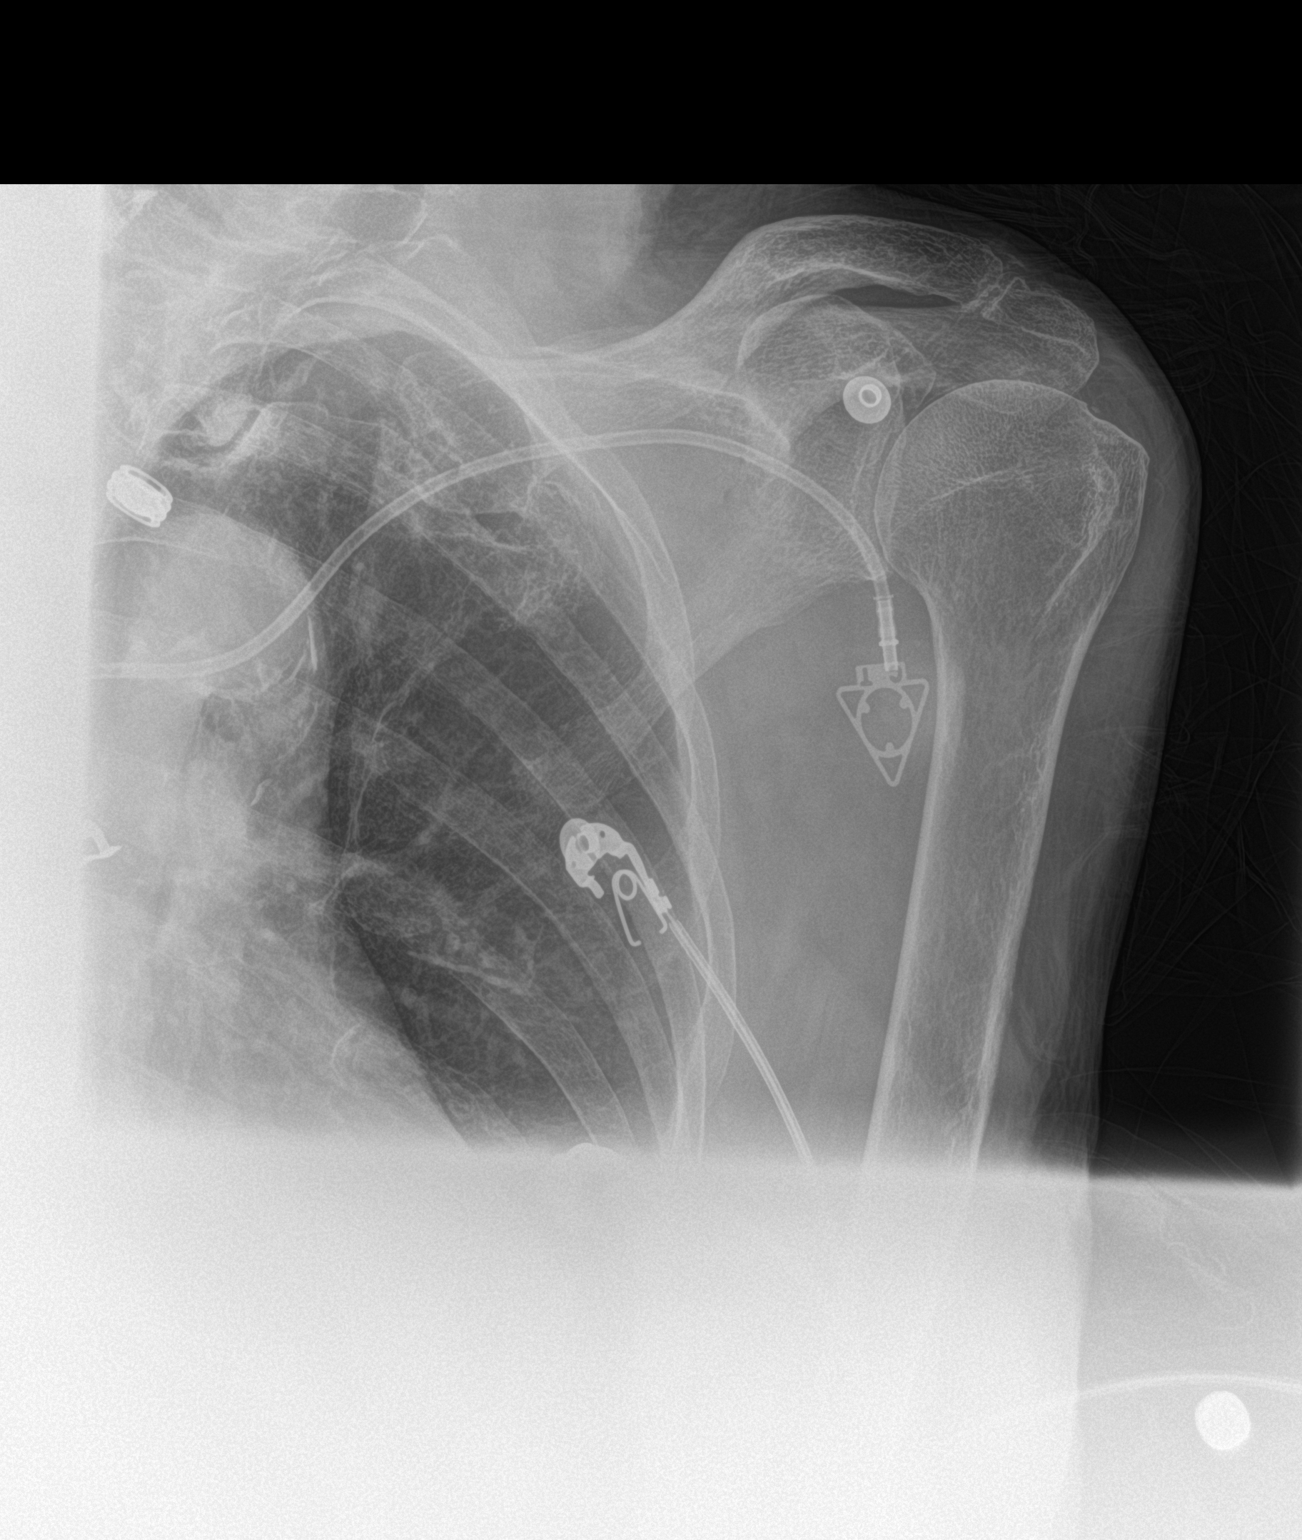
[im 2/2]
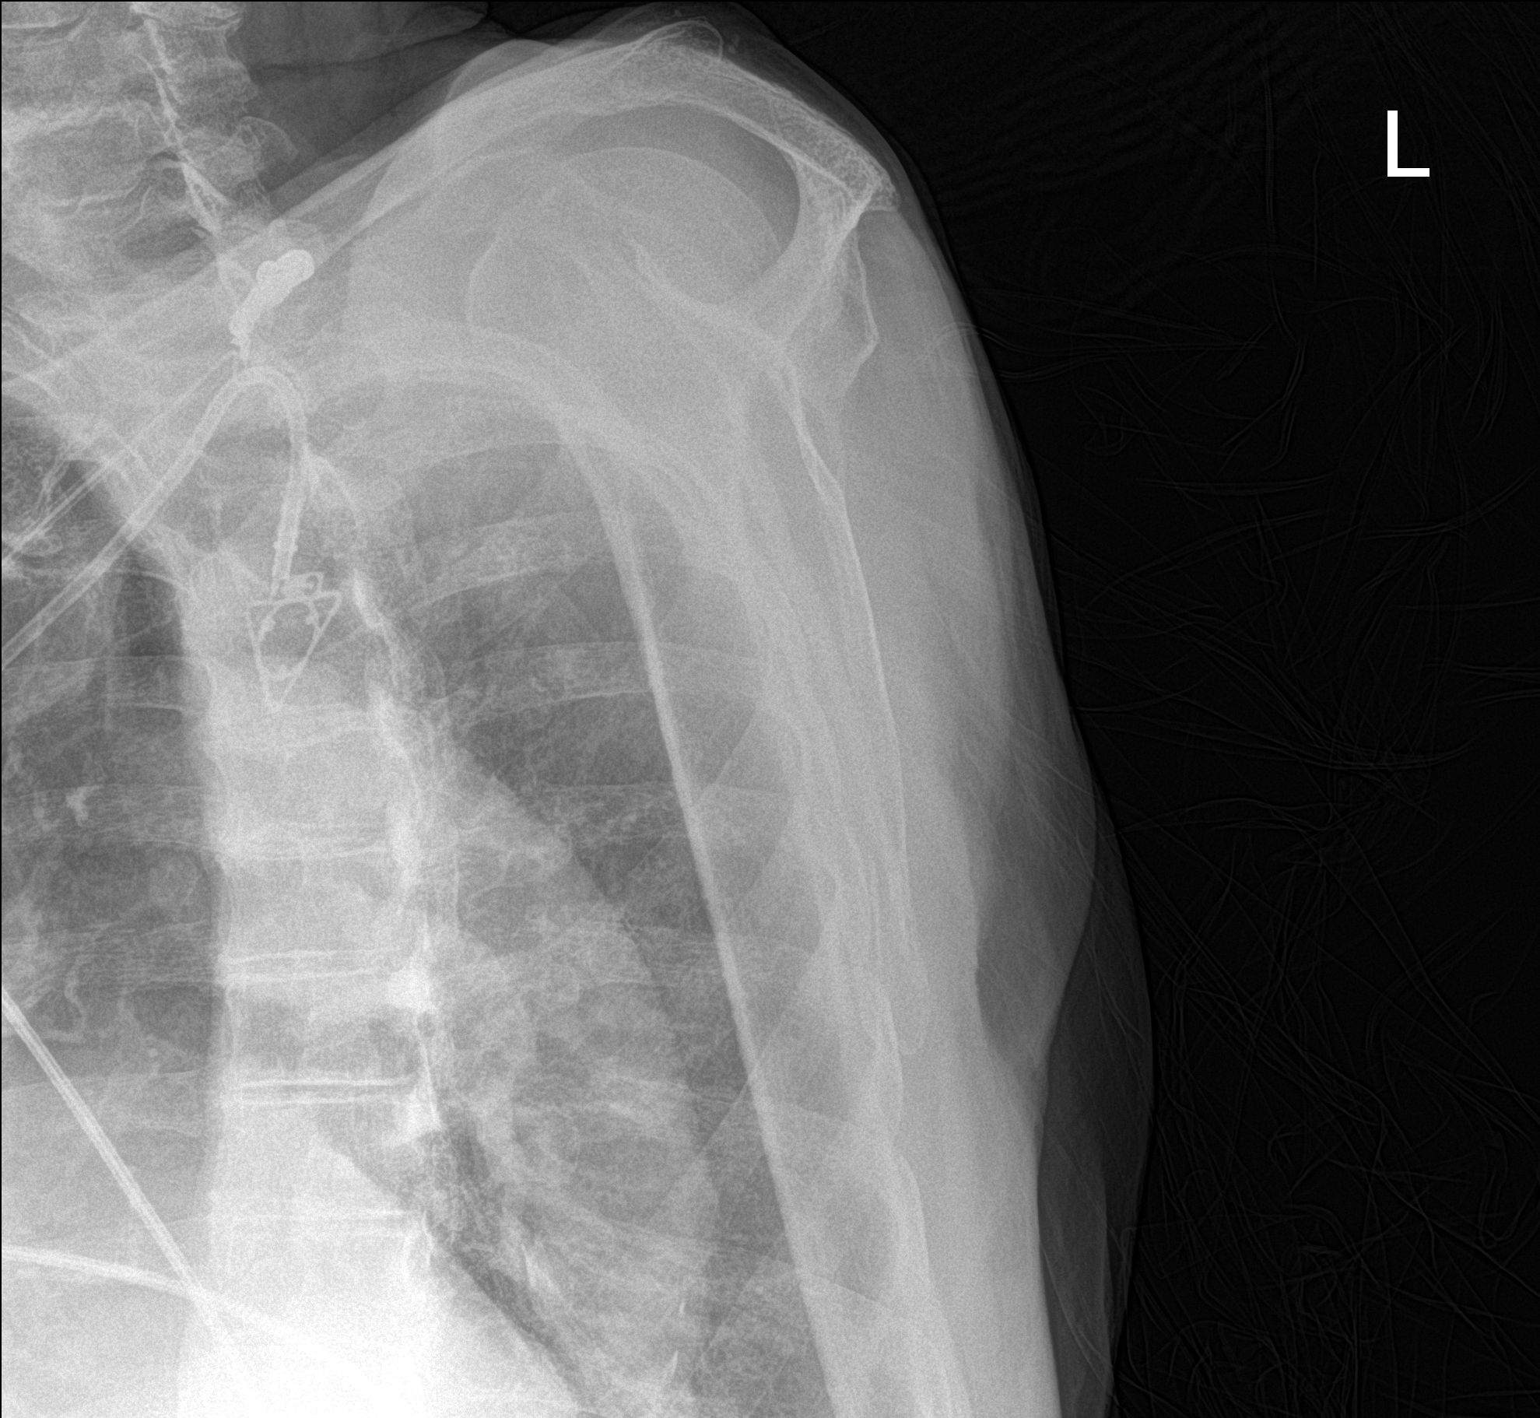

[2 of 2 positions shown; findings below may reference images not displayed]

FINDINGS: There is a left subclavian central venous catheter with tip beyond
the right lateral margin of the radiograph the left shoulder is
located. No acute fracture or dislocation. The bones appear of
osteopenic. There is moderate degenerative changes at the AC joint.
IMPRESSION: 1. No acute findings.
2. AC joint osteoarthritis.

## 2021-03-21 IMAGING — DX DG CHEST 1V PORT
1 series · 1 of 1 positions shown · non-contrast
Comparison: 05/22/2013

CLINICAL DATA: Pain.

EXAM:
PORTABLE CHEST 1 VIEW

[chest]
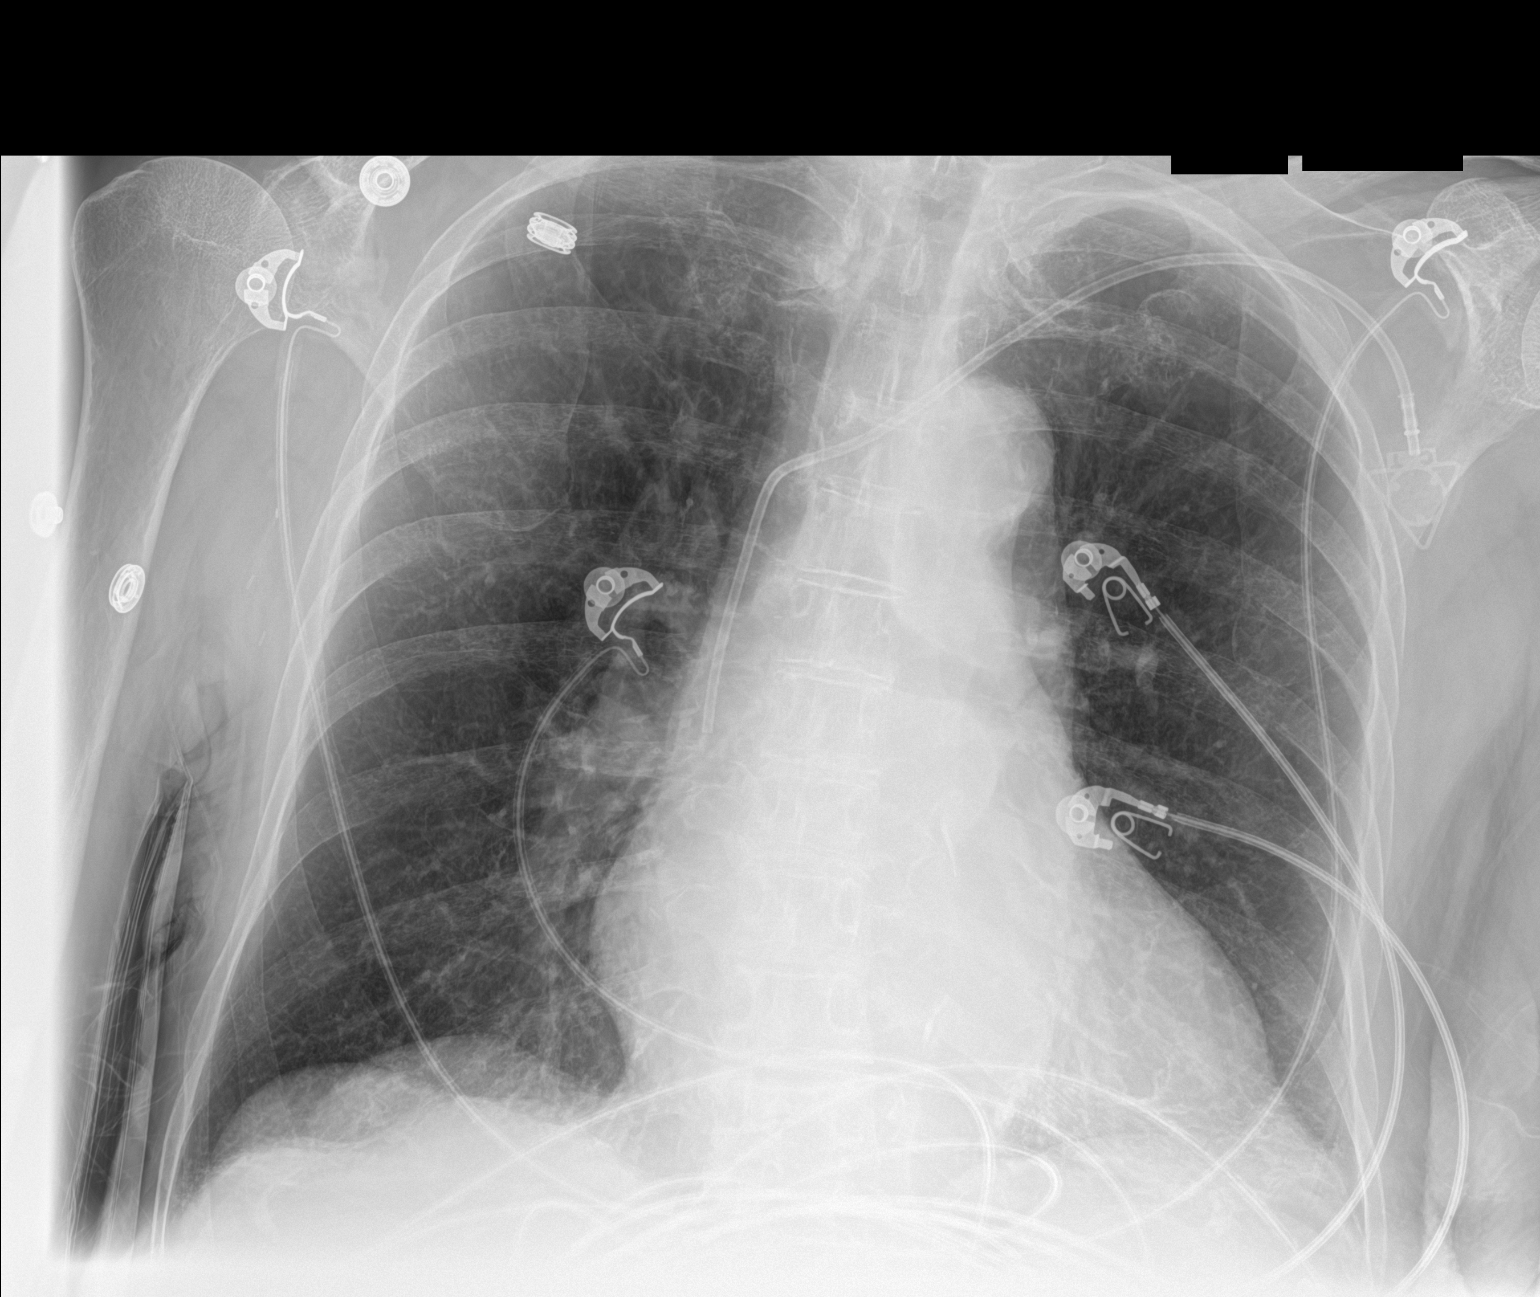

[1 of 1 positions shown; findings below may reference images not displayed]

FINDINGS: Interval placement of left subclavian port a catheter with tip at
the cavoatrial junction mild cardiac enlargement. No pleural
effusion or edema. No airspace densities identified. Lungs appear
hyperinflated with coarsened interstitial markings.
IMPRESSION: No acute cardiopulmonary abnormalities.

## 2022-03-30 IMAGING — DX DG FOOT COMPLETE 3+V*R*
3 series · 3 of 3 positions shown · non-contrast
Comparison: None.

CLINICAL DATA: RIGHT foot pain no injury.  Prior surgery

EXAM:
RIGHT FOOT COMPLETE - 3+ VIEW

[foot ap]
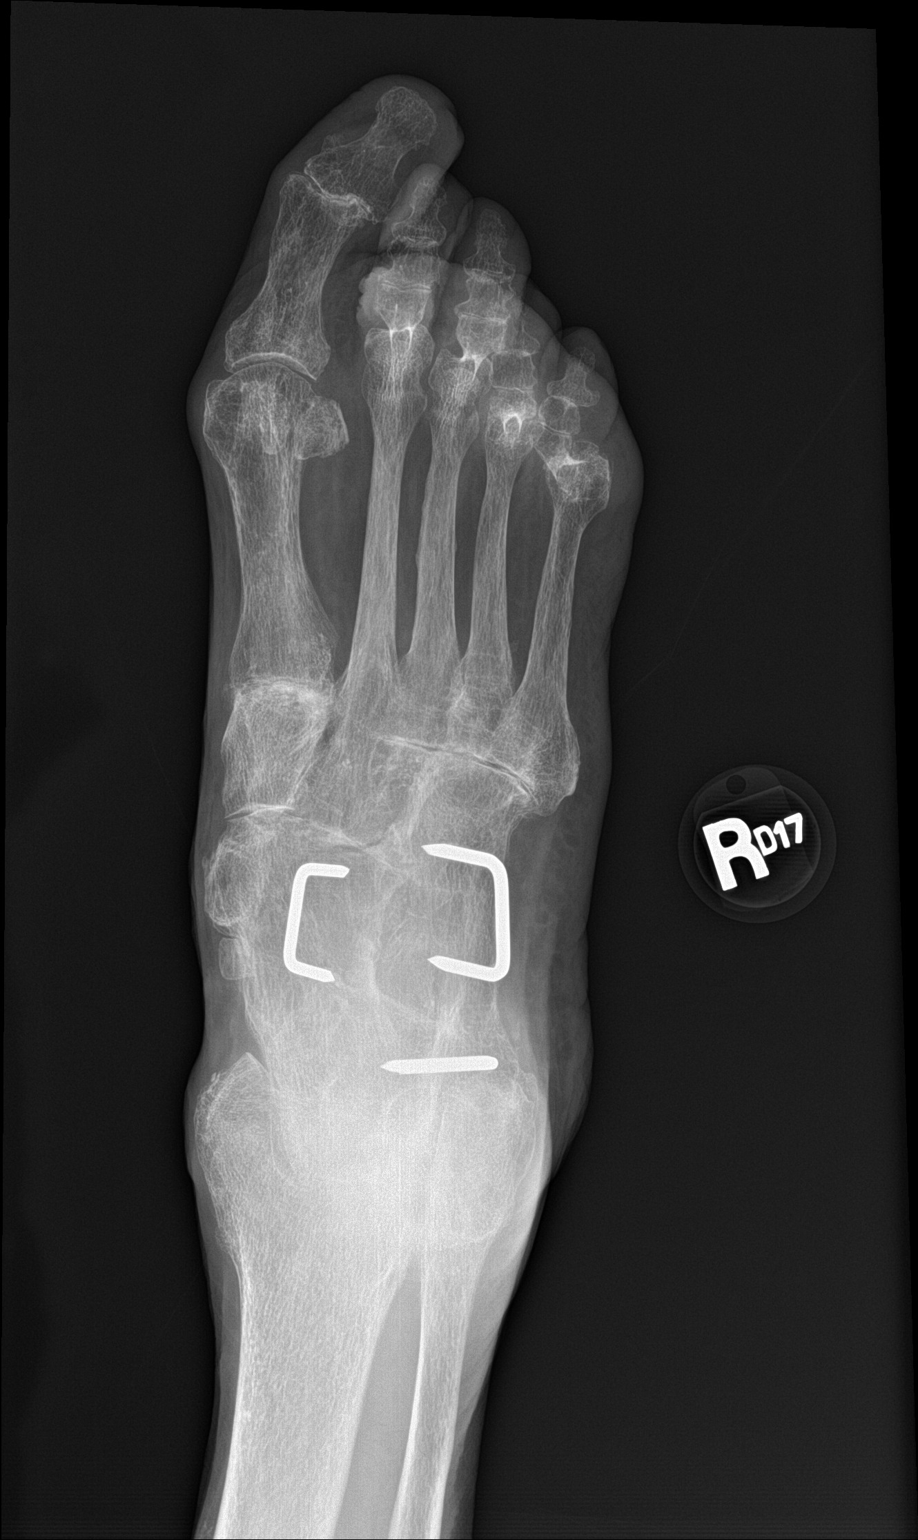

[foot obl]
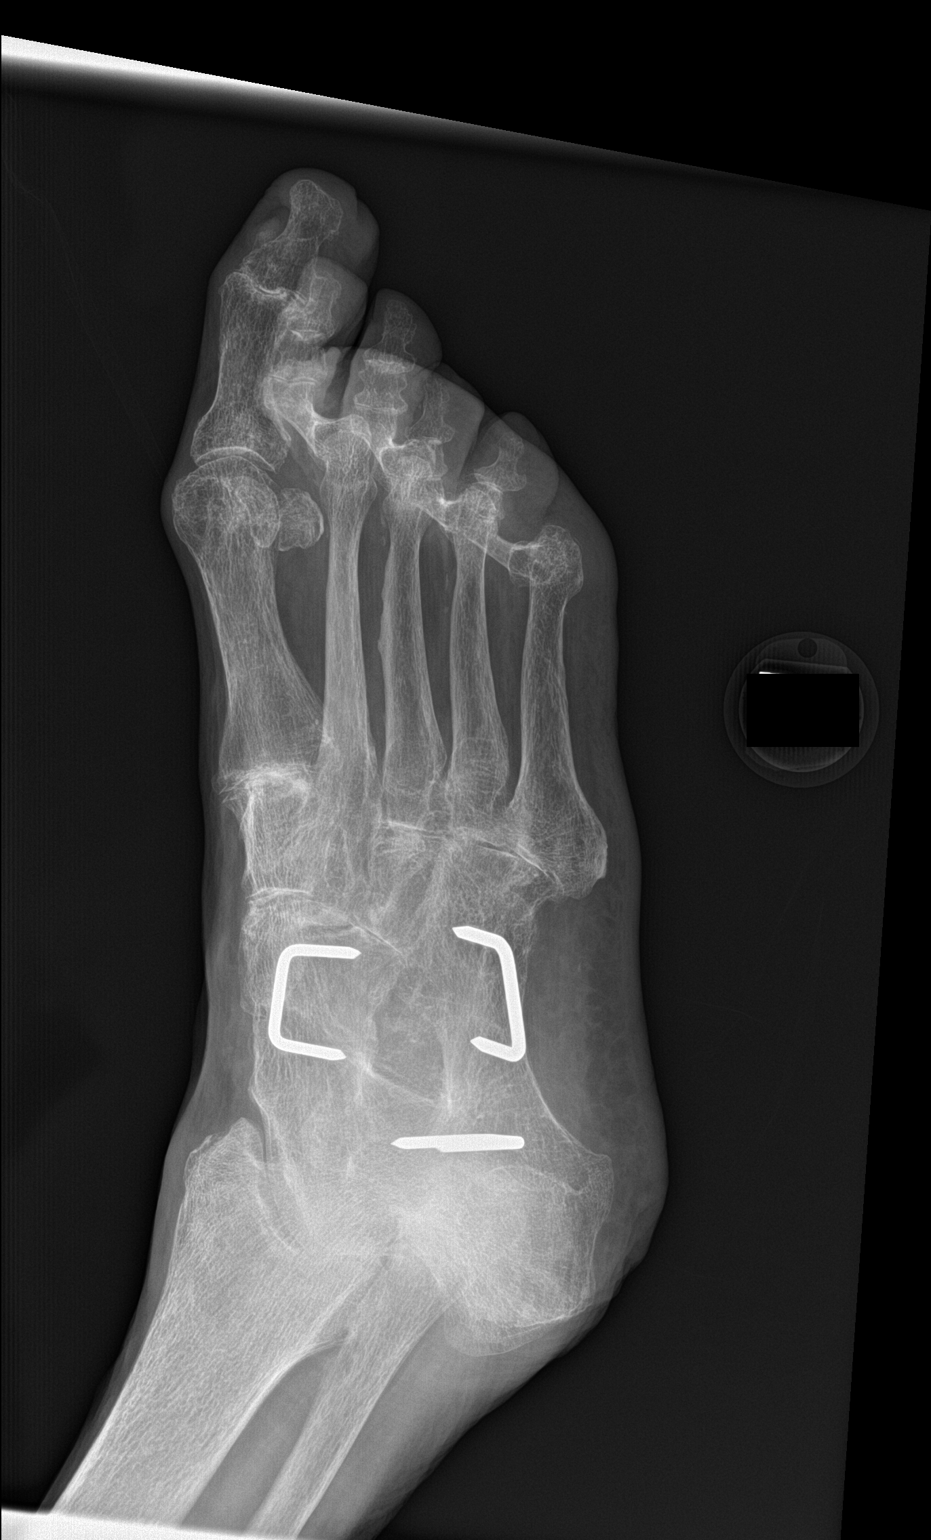

[foot lat]
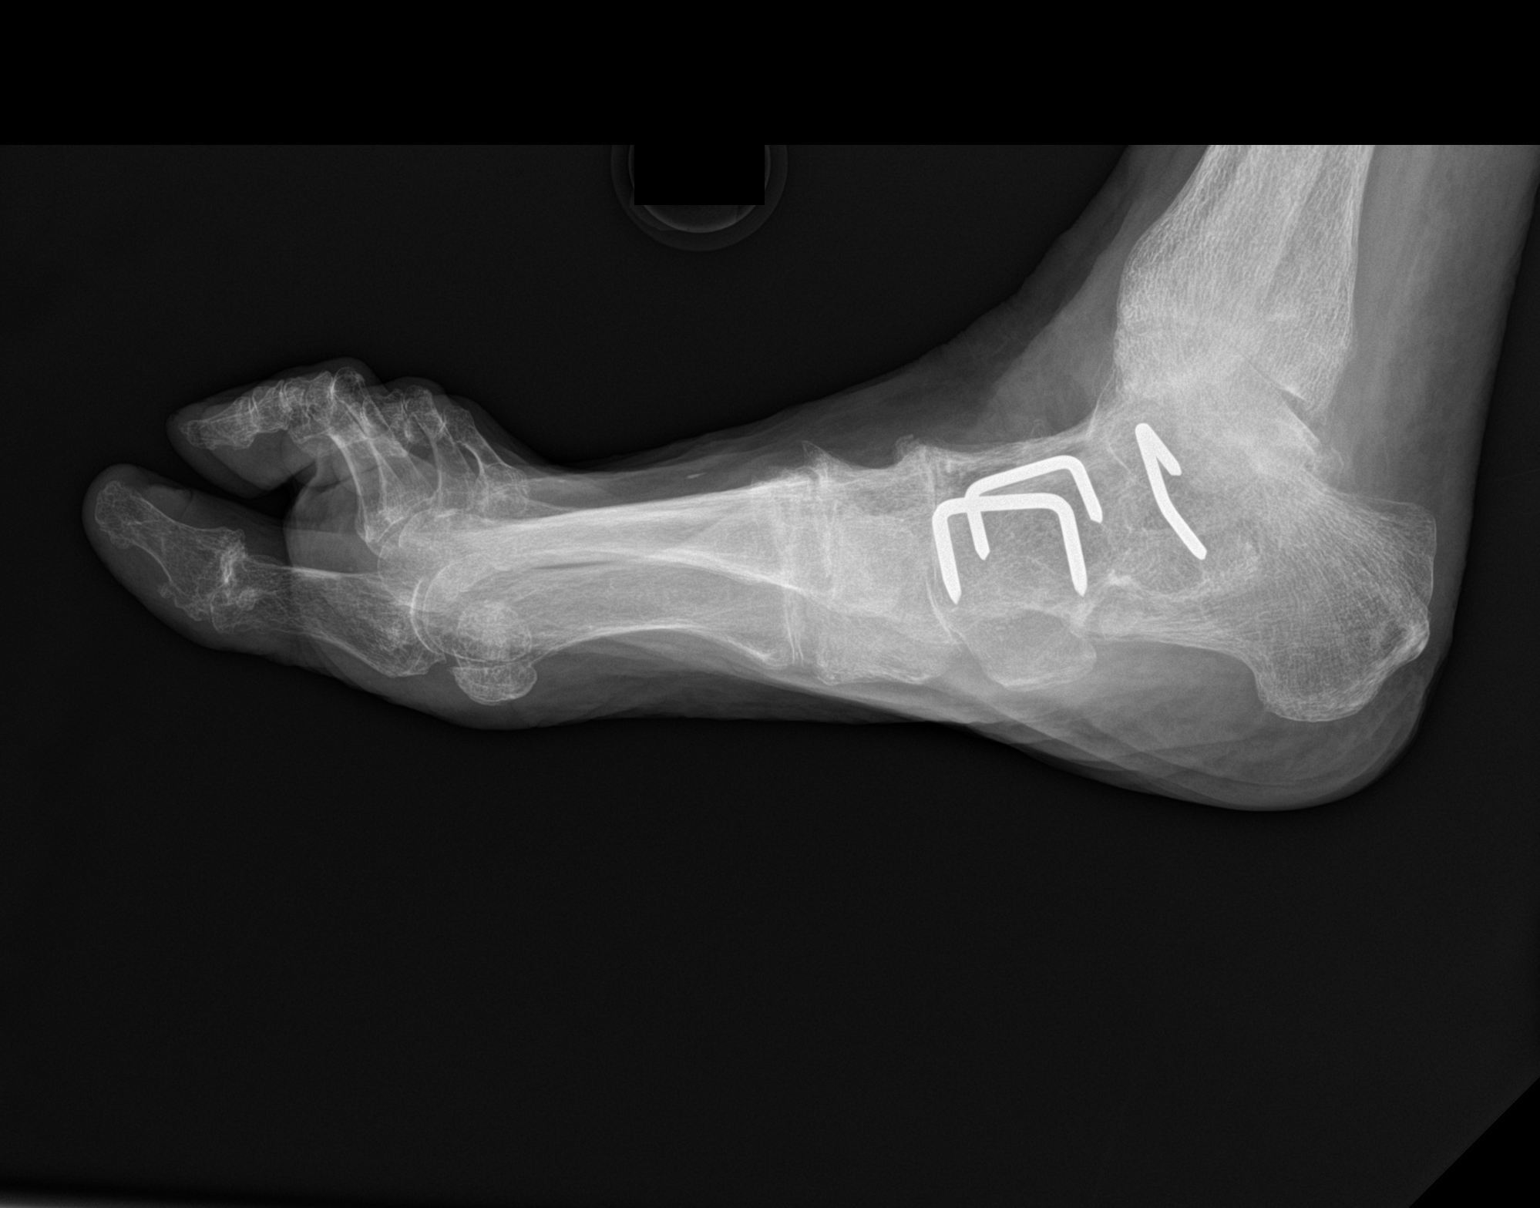

[3 of 3 positions shown; findings below may reference images not displayed]

FINDINGS: Large staple fusion of the tarsal bones. No acute osseous erosion or
fracture. No soft tissue abnormality.
IMPRESSION: No acute findings in the foot.
